# Patient Record
Sex: Female | Born: 1990 | Race: Black or African American | Hispanic: No | Marital: Single | Smoking: Current some day smoker
Health system: Southern US, Community
[De-identification: ages and names within clinical notes are randomized; demographics above are authoritative.]

## PROBLEM LIST (undated history)

## (undated) ENCOUNTER — Inpatient Hospital Stay (HOSPITAL_COMMUNITY): Payer: Self-pay

## (undated) DIAGNOSIS — A749 Chlamydial infection, unspecified: Secondary | ICD-10-CM

## (undated) DIAGNOSIS — G43909 Migraine, unspecified, not intractable, without status migrainosus: Secondary | ICD-10-CM

## (undated) DIAGNOSIS — N390 Urinary tract infection, site not specified: Secondary | ICD-10-CM

## (undated) DIAGNOSIS — A549 Gonococcal infection, unspecified: Secondary | ICD-10-CM

## (undated) DIAGNOSIS — N760 Acute vaginitis: Secondary | ICD-10-CM

## (undated) DIAGNOSIS — I10 Essential (primary) hypertension: Secondary | ICD-10-CM

## (undated) DIAGNOSIS — B999 Unspecified infectious disease: Secondary | ICD-10-CM

## (undated) DIAGNOSIS — B9689 Other specified bacterial agents as the cause of diseases classified elsewhere: Secondary | ICD-10-CM

## (undated) HISTORY — PX: HERNIA REPAIR: SHX51

## (undated) HISTORY — DX: Essential (primary) hypertension: I10

---
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
## 2008-09-23 ENCOUNTER — Emergency Department (HOSPITAL_COMMUNITY): Admission: EM | Admit: 2008-09-23 | Discharge: 2008-09-23 | Payer: Self-pay | Admitting: Family Medicine

## 2009-03-09 ENCOUNTER — Emergency Department (HOSPITAL_COMMUNITY): Admission: EM | Admit: 2009-03-09 | Discharge: 2009-03-09 | Payer: Self-pay | Admitting: Emergency Medicine

## 2009-04-14 ENCOUNTER — Inpatient Hospital Stay (HOSPITAL_COMMUNITY): Admission: AD | Admit: 2009-04-14 | Discharge: 2009-04-14 | Payer: Self-pay | Admitting: Obstetrics & Gynecology

## 2009-06-11 ENCOUNTER — Emergency Department (HOSPITAL_COMMUNITY): Admission: EM | Admit: 2009-06-11 | Discharge: 2009-06-11 | Payer: Self-pay | Admitting: Emergency Medicine

## 2009-07-04 ENCOUNTER — Emergency Department (HOSPITAL_COMMUNITY): Admission: EM | Admit: 2009-07-04 | Discharge: 2009-07-04 | Payer: Self-pay | Admitting: Emergency Medicine

## 2009-10-07 ENCOUNTER — Inpatient Hospital Stay (HOSPITAL_COMMUNITY): Admission: AD | Admit: 2009-10-07 | Discharge: 2009-10-07 | Payer: Self-pay | Admitting: Obstetrics & Gynecology

## 2010-05-12 ENCOUNTER — Inpatient Hospital Stay (HOSPITAL_COMMUNITY): Admission: AD | Admit: 2010-05-12 | Discharge: 2010-05-12 | Payer: Self-pay | Admitting: Obstetrics and Gynecology

## 2010-05-12 ENCOUNTER — Ambulatory Visit: Payer: Self-pay | Admitting: Obstetrics and Gynecology

## 2010-05-19 ENCOUNTER — Inpatient Hospital Stay (HOSPITAL_COMMUNITY): Admission: AD | Admit: 2010-05-19 | Discharge: 2010-05-19 | Payer: Self-pay | Admitting: Obstetrics and Gynecology

## 2010-05-19 ENCOUNTER — Ambulatory Visit: Payer: Self-pay | Admitting: Obstetrics and Gynecology

## 2010-07-10 ENCOUNTER — Inpatient Hospital Stay (HOSPITAL_COMMUNITY)
Admission: AD | Admit: 2010-07-10 | Discharge: 2010-07-10 | Payer: Self-pay | Source: Home / Self Care | Admitting: Obstetrics and Gynecology

## 2010-11-02 LAB — URINE CULTURE
Colony Count: NO GROWTH
Culture  Setup Time: 201111200243
Culture: NO GROWTH

## 2010-11-02 LAB — URINALYSIS, ROUTINE W REFLEX MICROSCOPIC
Bilirubin Urine: NEGATIVE
Glucose, UA: NEGATIVE mg/dL
Ketones, ur: >80 mg/dL — AB
Leukocytes, UA: NEGATIVE
Nitrite: POSITIVE — AB
Protein, ur: 30 mg/dL — AB
Specific Gravity, Urine: >1.03 — ABNORMAL HIGH (ref 1.005–1.030)
Urobilinogen, UA: 0.2 mg/dL (ref 0.0–1.0)
pH: 6 (ref 5.0–8.0)

## 2010-11-02 LAB — URINE MICROSCOPIC-ADD ON

## 2010-11-04 LAB — WET PREP, GENITAL
Clue Cells Wet Prep HPF POC: NONE SEEN
Trich, Wet Prep: NONE SEEN
Trich, Wet Prep: NONE SEEN
Yeast Wet Prep HPF POC: NONE SEEN
Yeast Wet Prep HPF POC: NONE SEEN

## 2010-11-04 LAB — URINALYSIS, ROUTINE W REFLEX MICROSCOPIC
Bilirubin Urine: NEGATIVE
Bilirubin Urine: NEGATIVE
Glucose, UA: NEGATIVE mg/dL
Glucose, UA: NEGATIVE mg/dL
Hgb urine dipstick: NEGATIVE
Ketones, ur: 15 mg/dL — AB
Ketones, ur: NEGATIVE mg/dL
Leukocytes, UA: NEGATIVE
Nitrite: NEGATIVE
Nitrite: NEGATIVE
Protein, ur: NEGATIVE mg/dL
Protein, ur: NEGATIVE mg/dL
Specific Gravity, Urine: 1.02 (ref 1.005–1.030)
Specific Gravity, Urine: 1.02 (ref 1.005–1.030)
Urobilinogen, UA: 0.2 mg/dL (ref 0.0–1.0)
Urobilinogen, UA: 0.2 mg/dL (ref 0.0–1.0)
pH: 6 (ref 5.0–8.0)
pH: 6.5 (ref 5.0–8.0)

## 2010-11-04 LAB — URINE MICROSCOPIC-ADD ON

## 2010-11-04 LAB — POCT PREGNANCY, URINE: Preg Test, Ur: POSITIVE

## 2010-11-04 LAB — GC/CHLAMYDIA PROBE AMP, GENITAL
Chlamydia, DNA Probe: NEGATIVE
Chlamydia, DNA Probe: NEGATIVE
GC Probe Amp, Genital: NEGATIVE
GC Probe Amp, Genital: NEGATIVE

## 2010-11-04 LAB — HCG, QUANTITATIVE, PREGNANCY: hCG, Beta Chain, Quant, S: 4562 m[IU]/mL — ABNORMAL HIGH (ref <5)

## 2010-11-10 LAB — URINALYSIS, ROUTINE W REFLEX MICROSCOPIC
Bilirubin Urine: NEGATIVE
Glucose, UA: NEGATIVE mg/dL
Ketones, ur: NEGATIVE mg/dL
Leukocytes, UA: NEGATIVE
Nitrite: NEGATIVE
Protein, ur: NEGATIVE mg/dL
Specific Gravity, Urine: 1.015 (ref 1.005–1.030)
Urobilinogen, UA: 0.2 mg/dL (ref 0.0–1.0)
pH: 7 (ref 5.0–8.0)

## 2010-11-10 LAB — POCT PREGNANCY, URINE: Preg Test, Ur: NEGATIVE

## 2010-11-10 LAB — WET PREP, GENITAL
Trich, Wet Prep: NONE SEEN
Yeast Wet Prep HPF POC: NONE SEEN

## 2010-11-10 LAB — URINE MICROSCOPIC-ADD ON

## 2010-11-10 LAB — GC/CHLAMYDIA PROBE AMP, GENITAL
Chlamydia, DNA Probe: NEGATIVE
GC Probe Amp, Genital: NEGATIVE

## 2010-11-24 LAB — URINALYSIS, ROUTINE W REFLEX MICROSCOPIC
Bilirubin Urine: NEGATIVE
Glucose, UA: NEGATIVE mg/dL
Hgb urine dipstick: NEGATIVE
Ketones, ur: NEGATIVE mg/dL
Nitrite: NEGATIVE
Protein, ur: NEGATIVE mg/dL
Specific Gravity, Urine: 1.022 (ref 1.005–1.030)
Urobilinogen, UA: 1 mg/dL (ref 0.0–1.0)
pH: 7.5 (ref 5.0–8.0)

## 2010-11-24 LAB — POCT PREGNANCY, URINE: Preg Test, Ur: NEGATIVE

## 2010-11-27 LAB — URINE MICROSCOPIC-ADD ON

## 2010-11-27 LAB — URINALYSIS, ROUTINE W REFLEX MICROSCOPIC
Bilirubin Urine: NEGATIVE
Glucose, UA: NEGATIVE mg/dL
Ketones, ur: NEGATIVE mg/dL
Nitrite: NEGATIVE
Protein, ur: NEGATIVE mg/dL
Specific Gravity, Urine: 1.02 (ref 1.005–1.030)
Urobilinogen, UA: 0.2 mg/dL (ref 0.0–1.0)
pH: 6 (ref 5.0–8.0)

## 2010-11-27 LAB — POCT PREGNANCY, URINE: Preg Test, Ur: NEGATIVE

## 2010-11-27 LAB — GC/CHLAMYDIA PROBE AMP, GENITAL: GC Probe Amp, Genital: NEGATIVE

## 2010-11-28 LAB — RAPID STREP SCREEN (MED CTR MEBANE ONLY): Streptococcus, Group A Screen (Direct): NEGATIVE

## 2010-11-29 ENCOUNTER — Inpatient Hospital Stay (HOSPITAL_COMMUNITY)
Admission: AD | Admit: 2010-11-29 | Discharge: 2010-11-29 | Disposition: A | Discharge status: Home / Self Care | Payer: Medicaid Other | Source: Ambulatory Visit | Attending: Obstetrics & Gynecology | Admitting: Obstetrics & Gynecology

## 2010-11-29 DIAGNOSIS — N390 Urinary tract infection, site not specified: Secondary | ICD-10-CM | POA: Insufficient documentation

## 2010-11-29 DIAGNOSIS — O2390 Unspecified genitourinary tract infection in pregnancy, unspecified trimester: Secondary | ICD-10-CM

## 2010-11-29 DIAGNOSIS — R319 Hematuria, unspecified: Secondary | ICD-10-CM | POA: Insufficient documentation

## 2010-11-29 DIAGNOSIS — N949 Unspecified condition associated with female genital organs and menstrual cycle: Secondary | ICD-10-CM | POA: Insufficient documentation

## 2010-11-29 LAB — URINE MICROSCOPIC-ADD ON

## 2010-11-29 LAB — URINALYSIS, ROUTINE W REFLEX MICROSCOPIC
Glucose, UA: NEGATIVE mg/dL
Nitrite: NEGATIVE
Specific Gravity, Urine: 1.015 (ref 1.005–1.030)
pH: 6 (ref 5.0–8.0)

## 2010-11-30 LAB — URINE CULTURE

## 2010-12-07 LAB — POCT URINALYSIS DIP (DEVICE)
Glucose, UA: NEGATIVE mg/dL
Ketones, ur: NEGATIVE mg/dL
Protein, ur: NEGATIVE mg/dL
Specific Gravity, Urine: 1.02 (ref 1.005–1.030)
Urobilinogen, UA: 1 mg/dL (ref 0.0–1.0)

## 2010-12-07 LAB — POCT PREGNANCY, URINE: Preg Test, Ur: NEGATIVE

## 2010-12-11 ENCOUNTER — Inpatient Hospital Stay (HOSPITAL_COMMUNITY)
Admission: AD | Admit: 2010-12-11 | Discharge: 2010-12-11 | Disposition: A | Discharge status: Home / Self Care | Payer: Medicaid Other | Source: Ambulatory Visit | Attending: Obstetrics and Gynecology | Admitting: Obstetrics and Gynecology

## 2010-12-11 DIAGNOSIS — O4700 False labor before 37 completed weeks of gestation, unspecified trimester: Secondary | ICD-10-CM | POA: Insufficient documentation

## 2010-12-27 ENCOUNTER — Inpatient Hospital Stay (HOSPITAL_COMMUNITY)
Admission: AD | Admit: 2010-12-27 | Discharge: 2010-12-30 | DRG: 775 | Disposition: A | Discharge status: Home / Self Care | Payer: Medicaid Other | Source: Ambulatory Visit | Attending: Obstetrics and Gynecology | Admitting: Obstetrics and Gynecology

## 2010-12-28 LAB — CBC
HCT: 36.7 % (ref 36.0–46.0)
MCV: 85.7 fL (ref 78.0–100.0)
Platelets: 144 10*3/uL — ABNORMAL LOW (ref 150–400)
RBC: 4.28 MIL/uL (ref 3.87–5.11)
RDW: 13.9 % (ref 11.5–15.5)
WBC: 13.9 10*3/uL — ABNORMAL HIGH (ref 4.0–10.5)

## 2010-12-28 LAB — ABO/RH: ABO/RH(D): O POS

## 2010-12-29 LAB — CBC
MCV: 85 fL (ref 78.0–100.0)
Platelets: 134 10*3/uL — ABNORMAL LOW (ref 150–400)
RBC: 3.74 MIL/uL — ABNORMAL LOW (ref 3.87–5.11)
RDW: 13.9 % (ref 11.5–15.5)
WBC: 17.2 10*3/uL — ABNORMAL HIGH (ref 4.0–10.5)

## 2012-05-27 ENCOUNTER — Encounter (HOSPITAL_COMMUNITY): Payer: Self-pay | Admitting: Obstetrics and Gynecology

## 2012-05-27 ENCOUNTER — Inpatient Hospital Stay (HOSPITAL_COMMUNITY)
Admission: AD | Admit: 2012-05-27 | Discharge: 2012-05-27 | Disposition: A | Discharge status: Home / Self Care | Payer: Medicaid Other | Source: Ambulatory Visit | Attending: Obstetrics & Gynecology | Admitting: Obstetrics & Gynecology

## 2012-05-27 DIAGNOSIS — A499 Bacterial infection, unspecified: Secondary | ICD-10-CM

## 2012-05-27 DIAGNOSIS — T8339XA Other mechanical complication of intrauterine contraceptive device, initial encounter: Secondary | ICD-10-CM | POA: Insufficient documentation

## 2012-05-27 DIAGNOSIS — N760 Acute vaginitis: Secondary | ICD-10-CM | POA: Insufficient documentation

## 2012-05-27 DIAGNOSIS — T839XXA Unspecified complication of genitourinary prosthetic device, implant and graft, initial encounter: Secondary | ICD-10-CM

## 2012-05-27 DIAGNOSIS — R300 Dysuria: Secondary | ICD-10-CM | POA: Insufficient documentation

## 2012-05-27 DIAGNOSIS — N390 Urinary tract infection, site not specified: Secondary | ICD-10-CM

## 2012-05-27 DIAGNOSIS — B9689 Other specified bacterial agents as the cause of diseases classified elsewhere: Secondary | ICD-10-CM | POA: Insufficient documentation

## 2012-05-27 HISTORY — DX: Urinary tract infection, site not specified: N39.0

## 2012-05-27 LAB — URINALYSIS, ROUTINE W REFLEX MICROSCOPIC
Leukocytes, UA: NEGATIVE
Nitrite: NEGATIVE
Specific Gravity, Urine: >1.03 — ABNORMAL HIGH (ref 1.005–1.030)
Urobilinogen, UA: 0.2 mg/dL (ref 0.0–1.0)
pH: 6 (ref 5.0–8.0)

## 2012-05-27 LAB — URINE MICROSCOPIC-ADD ON

## 2012-05-27 LAB — WET PREP, GENITAL

## 2012-05-27 LAB — POCT PREGNANCY, URINE: Preg Test, Ur: NEGATIVE

## 2012-05-27 MED ORDER — CIPROFLOXACIN HCL 500 MG PO TABS: 500.0000 mg | ORAL_TABLET | Freq: Two times a day (BID) | ORAL | Status: DC

## 2012-05-27 MED ORDER — METRONIDAZOLE 500 MG PO TABS: 500.0000 mg | ORAL_TABLET | Freq: Two times a day (BID) | ORAL | Status: DC

## 2012-05-27 MED ORDER — NORGESTIMATE-ETH ESTRADIOL 0.25-35 MG-MCG PO TABS: 1.0000 | ORAL_TABLET | Freq: Every day | ORAL | Status: DC

## 2012-05-27 NOTE — MAU Note (Signed)
Pt reports she has been having burning and pain with urination for several month. Has had and odor and whit to brown  vaginal  Discharge. Pt thinks she has a UTI and BV.

## 2012-05-27 NOTE — MAU Note (Signed)
"  I have been having burning when I pee, lower back pain, vaginal odor and vaginal discharge since about June."

## 2012-05-27 NOTE — MAU Provider Note (Signed)
Chief Complaint: Dysuria  First Provider Initiated Contact with Patient 05/27/12 1508     SUBJECTIVE HPI: Eileen Mcfarland is a 21 y.o. G0 who presents with: 1. Burning pain, frequency and urgency with urination for several months.  2. Brownish, mucousy vaginal discharge with odor x several months.  3. Mild left flank pain x ~1 week 4. Upon further questioning pt reports her partner complaining at feeling a sharp poking sensation during IC.  Denies fever, chills, N/V. Had Mirena IUD placed in May 2013.   Past Medical History  Diagnosis Date  . Urinary tract infection    OB History    Grav Para Term Preterm Abortions TAB SAB Ect Mult Living   2    1 1    1      # Outc Date GA Lbr Len/2nd Wgt Sex Del Anes PTL Lv   1 TAB 10/08           2 GRA 5/12    F    Yes     Past Surgical History  Procedure Date  . Hernia repair     as a child   History   Social History  . Marital Status: Single    Spouse Name: N/A    Number of Children: N/A  . Years of Education: N/A   Occupational History  . Not on file.   Social History Main Topics  . Smoking status: Not on file  . Smokeless tobacco: Not on file  . Alcohol Use: Yes     socially  . Drug Use: No  . Sexually Active: Yes    Birth Control/ Protection: IUD   Other Topics Concern  . Not on file   Social History Narrative  . No narrative on file   No current facility-administered medications on file prior to encounter.   Current Outpatient Prescriptions on File Prior to Encounter  Medication Sig Dispense Refill  . norgestimate-ethinyl estradiol (ORTHO-CYCLEN,SPRINTEC,PREVIFEM) 0.25-35 MG-MCG tablet Take 1 tablet by mouth daily.  1 Package  2   No Known Allergies  ROS: Pertinent items in HPI  OBJECTIVE Blood pressure 119/65, pulse 86, temperature 98.3 F (36.8 C), temperature source Oral, resp. rate 18, height 5\' 2"  (1.575 m), weight 53.162 kg (117 lb 3.2 oz), last menstrual period 05/23/2012, unknown if currently  breastfeeding. GENERAL: Well-developed, well-nourished female in no acute distress.  HEENT: Normocephalic HEART: normal rate RESP: normal effort ABDOMEN: Soft, non-tender, mild left CVAT EXTREMITIES: Nontender, no edema NEURO: Alert and oriented SPECULUM EXAM: NEFG, small amount of malodorous, non-purulent mucous tinged w/ brown blood noted, IUD protruding 0.5 cm out of external os. 1 cm ectropion noted. cervix otherwise clean, non-friable.  BIMANUAL: cervix 1 cm; uterus normal size, no CMT, adnexal tenderness or masses  LAB RESULTS Results for orders placed during the hospital encounter of 05/27/12 (from the past 24 hour(s))  URINALYSIS, ROUTINE W REFLEX MICROSCOPIC     Status: Abnormal   Collection Time   05/27/12  1:44 PM      Component Value Range   Color, Urine YELLOW  YELLOW   APPearance CLEAR  CLEAR   Specific Gravity, Urine >1.030 (*) 1.005 - 1.030   pH 6.0  5.0 - 8.0   Glucose, UA NEGATIVE  NEGATIVE mg/dL   Hgb urine dipstick TRACE (*) NEGATIVE   Bilirubin Urine NEGATIVE  NEGATIVE   Ketones, ur NEGATIVE  NEGATIVE mg/dL   Protein, ur NEGATIVE  NEGATIVE mg/dL   Urobilinogen, UA 0.2  0.0 - 1.0  mg/dL   Nitrite NEGATIVE  NEGATIVE   Leukocytes, UA NEGATIVE  NEGATIVE  URINE MICROSCOPIC-ADD ON     Status: Abnormal   Collection Time   05/27/12  1:44 PM      Component Value Range   Squamous Epithelial / LPF RARE  RARE   WBC, UA 0-2  <3 WBC/hpf   RBC / HPF 0-2  <3 RBC/hpf   Bacteria, UA FEW (*) RARE   Urine-Other MUCOUS PRESENT    POCT PREGNANCY, URINE     Status: Normal   Collection Time   05/27/12  2:08 PM      Component Value Range   Preg Test, Ur NEGATIVE  NEGATIVE  WET PREP, GENITAL     Status: Abnormal   Collection Time   05/27/12  2:45 PM      Component Value Range   Yeast Wet Prep HPF POC NONE SEEN  NONE SEEN   Trich, Wet Prep NONE SEEN  NONE SEEN   Clue Cells Wet Prep HPF POC MODERATE (*) NONE SEEN   WBC, Wet Prep HPF POC FEW (*) NONE SEEN    IMAGING No  results found.  MAU COURSE Discussed w/ pt that IUD is being expelled. Gave her option of removal at private OB or removal now. Recommended no IC until removal and to be prepared for alternative contraception and increased cramping w/ completion of expulsion. Pt elects removal in MAU.  Procedure Note: Speculum placed. IUD strings and end of IUD visualized. Strings grasped w/ ring forceps. IUD removed easily w/ gentle traction. No bleeding. Pt tolerated procedure well.    ASSESSMENT 1. BV (bacterial vaginosis)   2. IUD complication-partial expulsion  3. UTI (lower urinary tract infection) with possible mild left pyelo    PLAN Discharge home Start OCPs today. List of contraceptive options given. Take Cipro BID for 1 week. If Sx improving, but incompletely resolved refill and Rx and take x 1 more week. Return to MAU or ED for fever, vomiting or worsening flank pain for possible IV ABX.     Follow-up Information    Follow up with Levi Aland, MD. (As needed for contraception)    Contact information:   [address] [place] [address] [phone]           Medication List     As of 05/27/2012  3:39 PM    TAKE these medications         ciprofloxacin 500 MG tablet   Commonly known as: CIPRO   Take 1 tablet (500 mg total) by mouth 2 (two) times daily.      metroNIDAZOLE 500 MG tablet   Commonly known as: FLAGYL   Take 1 tablet (500 mg total) by mouth 2 (two) times daily.      norgestimate-ethinyl estradiol 0.25-35 MG-MCG tablet   Commonly known as: ORTHO-CYCLEN,SPRINTEC,PREVIFEM   Take 1 tablet by mouth daily.        [place], [place] 05/27/2012  3:39 PM

## 2012-05-28 LAB — GC/CHLAMYDIA PROBE AMP, GENITAL
Chlamydia, DNA Probe: NEGATIVE
GC Probe Amp, Genital: NEGATIVE

## 2012-08-15 ENCOUNTER — Emergency Department (HOSPITAL_COMMUNITY)
Admission: EM | Admit: 2012-08-15 | Discharge: 2012-08-16 | Disposition: A | Discharge status: Home / Self Care | Payer: Medicaid Other | Attending: Emergency Medicine | Admitting: Emergency Medicine

## 2012-08-15 ENCOUNTER — Encounter (HOSPITAL_COMMUNITY): Payer: Self-pay | Admitting: Emergency Medicine

## 2012-08-15 DIAGNOSIS — R928 Other abnormal and inconclusive findings on diagnostic imaging of breast: Secondary | ICD-10-CM | POA: Insufficient documentation

## 2012-08-15 DIAGNOSIS — F17200 Nicotine dependence, unspecified, uncomplicated: Secondary | ICD-10-CM | POA: Insufficient documentation

## 2012-08-15 DIAGNOSIS — R51 Headache: Secondary | ICD-10-CM | POA: Insufficient documentation

## 2012-08-15 DIAGNOSIS — N644 Mastodynia: Secondary | ICD-10-CM | POA: Insufficient documentation

## 2012-08-15 DIAGNOSIS — N390 Urinary tract infection, site not specified: Secondary | ICD-10-CM | POA: Insufficient documentation

## 2012-08-15 DIAGNOSIS — Z3202 Encounter for pregnancy test, result negative: Secondary | ICD-10-CM | POA: Insufficient documentation

## 2012-08-15 DIAGNOSIS — N63 Unspecified lump in unspecified breast: Secondary | ICD-10-CM

## 2012-08-15 LAB — CBC
MCV: 87.2 fL (ref 78.0–100.0)
Platelets: 265 10*3/uL (ref 150–400)
RBC: 4.52 MIL/uL (ref 3.87–5.11)
RDW: 12.9 % (ref 11.5–15.5)
WBC: 7.7 10*3/uL (ref 4.0–10.5)

## 2012-08-15 LAB — URINE MICROSCOPIC-ADD ON

## 2012-08-15 LAB — URINALYSIS, ROUTINE W REFLEX MICROSCOPIC
Glucose, UA: NEGATIVE mg/dL
Nitrite: NEGATIVE
Protein, ur: NEGATIVE mg/dL
Urobilinogen, UA: 1 mg/dL (ref 0.0–1.0)

## 2012-08-15 LAB — BASIC METABOLIC PANEL
Chloride: 101 mEq/L (ref 96–112)
Creatinine, Ser: 0.69 mg/dL (ref 0.50–1.10)
GFR calc Af Amer: >90 mL/min (ref >90)
Sodium: 135 mEq/L (ref 135–145)

## 2012-08-15 LAB — PREGNANCY, URINE: Preg Test, Ur: NEGATIVE

## 2012-08-15 MED ORDER — CEPHALEXIN 500 MG PO CAPS: 500.0000 mg | ORAL_CAPSULE | Freq: Four times a day (QID) | ORAL | Status: DC

## 2012-08-15 MED ORDER — HYDROCODONE-ACETAMINOPHEN 5-500 MG PO TABS: 1.0000 | ORAL_TABLET | Freq: Four times a day (QID) | ORAL | Status: DC | PRN

## 2012-08-15 MED ORDER — CEPHALEXIN 500 MG PO CAPS
500.0000 mg | ORAL_CAPSULE | Freq: Once | ORAL | Status: AC
  Administered 2012-08-15: 500 mg via ORAL
  Filled 2012-08-15: qty 1

## 2012-08-15 NOTE — ED Notes (Signed)
Pt states she has had dizziness and nausea x 2 weeks. Pt also states that she found a lump in her rt breast on the 23rd and it is hurting.

## 2012-08-15 NOTE — ED Provider Notes (Signed)
History     CSN: [id number]  Arrival date & time 08/15/12  2116   First MD Initiated Contact with Patient 08/15/12 2141      Chief Complaint  Patient presents with  . Dizziness  . Headache    (Consider location/radiation/quality/duration/timing/severity/associated sxs/prior treatment) HPI Eileen Mcfarland is a 21 y.o. female who presents with complaint of pain in her left breast, nodule in left breast that she noted 2 days ago. States it is tender. States no hx of the same. Took hydrocodone for the pain with some improvement. Also reports headache, dizziness on and off for last two weeks. Denies fever, chills, malaise. No injury to the breast. States due for her menustrual cycle any day, not pregnant. Denies nipple discharge. No hx of abscesses.   Past Medical History  Diagnosis Date  . Urinary tract infection     Past Surgical History  Procedure Date  . Hernia repair     as a child    No family history on file.  History  Substance Use Topics  . Smoking status: Current Some Day Smoker -- 3.0 packs/day    Types: Cigars  . Smokeless tobacco: Not on file  . Alcohol Use: Yes     Comment: socially    OB History    Grav Para Term Preterm Abortions TAB SAB Ect Mult Living   2    1 1    1       Review of Systems  Constitutional: Negative for fever and chills.  Respiratory: Negative.   Cardiovascular: Negative.   Gastrointestinal: Negative.   Genitourinary: Negative.   Musculoskeletal: Negative.   Skin: Negative.   Neurological: Positive for dizziness, light-headedness and headaches. Negative for weakness.  Hematological: Negative.     Allergies  Review of patient's allergies indicates no known allergies.  Home Medications  No current outpatient prescriptions on file.  BP 150/87  Pulse 107  Temp 98.8 F (37.1 C) (Oral)  Resp 20  SpO2 100%  LMP 07/19/2012  Breastfeeding? No  Physical Exam  Nursing note and vitals reviewed. Constitutional: She is oriented  to person, place, and time. She appears well-developed and well-nourished. No distress.  HENT:  Head: Normocephalic.  Eyes: Conjunctivae normal are normal.  Neck: Normal range of motion. Neck supple.  Cardiovascular: Normal rate, regular rhythm and normal heart sounds.   Pulmonary/Chest: Effort normal and breath sounds normal. No respiratory distress. She has no wheezes. She has no rales.       About 3cmx3cm nodule in left mid breast just below the nipple. Nodule is deep, tender. No skin changes over the breast. No nipple discharge.   Abdominal: Soft. Bowel sounds are normal. She exhibits no distension. There is no tenderness. There is no rebound.  Musculoskeletal: She exhibits no edema.  Neurological: She is alert and oriented to person, place, and time.  Skin: Skin is warm and dry.  Psychiatric: She has a normal mood and affect. Her behavior is normal.    ED Course  Procedures (including critical care time)  Results for orders placed during the hospital encounter of 08/15/12  CBC      Component Value Range   WBC 7.7  4.0 - 10.5 K/uL   RBC 4.52  3.87 - 5.11 MIL/uL   Hemoglobin 13.2  12.0 - 15.0 g/dL   HCT 40.9  81.1 - 91.4 %   MCV 87.2  78.0 - 100.0 fL   MCH 29.2  26.0 - 34.0 pg   MCHC 33.5  30.0 - 36.0 g/dL   RDW 16.1  09.6 - 04.5 %   Platelets 265  150 - 400 K/uL  BASIC METABOLIC PANEL      Component Value Range   Sodium 135  135 - 145 mEq/L   Potassium 3.8  3.5 - 5.1 mEq/L   Chloride 101  96 - 112 mEq/L   CO2 23  19 - 32 mEq/L   Glucose, Bld 89  70 - 99 mg/dL   BUN 10  6 - 23 mg/dL   Creatinine, Ser 4.09  0.50 - 1.10 mg/dL   Calcium 9.3  8.4 - 81.1 mg/dL   GFR calc non Af Amer >90  >90 mL/min   GFR calc Af Amer >90  >90 mL/min  PREGNANCY, URINE      Component Value Range   Preg Test, Ur NEGATIVE  NEGATIVE  URINALYSIS, ROUTINE W REFLEX MICROSCOPIC      Component Value Range   Color, Urine YELLOW  YELLOW   APPearance CLOUDY (*) CLEAR   Specific Gravity, Urine 1.024   1.005 - 1.030   pH 6.5  5.0 - 8.0   Glucose, UA NEGATIVE  NEGATIVE mg/dL   Hgb urine dipstick TRACE (*) NEGATIVE   Bilirubin Urine NEGATIVE  NEGATIVE   Ketones, ur NEGATIVE  NEGATIVE mg/dL   Protein, ur NEGATIVE  NEGATIVE mg/dL   Urobilinogen, UA 1.0  0.0 - 1.0 mg/dL   Nitrite NEGATIVE  NEGATIVE   Leukocytes, UA LARGE (*) NEGATIVE  URINE MICROSCOPIC-ADD ON      Component Value Range   Squamous Epithelial / LPF MANY (*) RARE   WBC, UA 11-20  <3 WBC/hpf   RBC / HPF 0-2  <3 RBC/hpf   Bacteria, UA MANY (*) RARE   No results found.    1. Breast pain   2. Breast nodule   3. UTI (lower urinary tract infection)       MDM  Pt with tender nodule/cyst to the left breast. Worrisome for small early abscess, vs a cyst. There is no redness, no skin changes. Pt afebrile. Non toxic appearing.  Pt also has a UTI. Will cover with keflex. First dose given here. Will refer to breast center in AM with [place] of left breast ordered. Pt instructed to go there for further testing. Pt voiced understanding.         Lottie Mussel, PA 08/16/12 [phone]

## 2012-08-15 NOTE — ED Notes (Signed)
Pt presents to the ED with a complaint of dizziness and a headache for over 2 weeks.  Pt also states" I found a lump in my left breast and it has started to hurt."

## 2012-08-16 ENCOUNTER — Ambulatory Visit
Admission: RE | Admit: 2012-08-16 | Discharge: 2012-08-16 | Disposition: A | Discharge status: Home / Self Care | Payer: Medicaid Other | Source: Ambulatory Visit | Attending: Emergency Medicine | Admitting: Emergency Medicine

## 2012-08-17 LAB — URINE CULTURE
Colony Count: NO GROWTH
Culture: NO GROWTH

## 2012-08-18 NOTE — ED Provider Notes (Signed)
Medical screening examination/treatment/procedure(s) were performed by non-physician practitioner and as supervising physician I was immediately available for consultation/collaboration.  Joclynn Lumb, MD 08/18/12 1815 

## 2012-11-04 ENCOUNTER — Inpatient Hospital Stay (HOSPITAL_COMMUNITY)
Admission: AD | Admit: 2012-11-04 | Discharge: 2012-11-04 | Disposition: A | Discharge status: Home / Self Care | Payer: Medicaid Other | Source: Ambulatory Visit | Attending: Obstetrics & Gynecology | Admitting: Obstetrics & Gynecology

## 2012-11-04 ENCOUNTER — Encounter (HOSPITAL_COMMUNITY): Payer: Self-pay | Admitting: Family

## 2012-11-04 DIAGNOSIS — Z331 Pregnant state, incidental: Secondary | ICD-10-CM

## 2012-11-04 DIAGNOSIS — R300 Dysuria: Secondary | ICD-10-CM | POA: Insufficient documentation

## 2012-11-04 DIAGNOSIS — N949 Unspecified condition associated with female genital organs and menstrual cycle: Secondary | ICD-10-CM | POA: Insufficient documentation

## 2012-11-04 DIAGNOSIS — Z113 Encounter for screening for infections with a predominantly sexual mode of transmission: Secondary | ICD-10-CM

## 2012-11-04 DIAGNOSIS — Z7251 High risk heterosexual behavior: Secondary | ICD-10-CM

## 2012-11-04 DIAGNOSIS — R109 Unspecified abdominal pain: Secondary | ICD-10-CM | POA: Insufficient documentation

## 2012-11-04 DIAGNOSIS — Z202 Contact with and (suspected) exposure to infections with a predominantly sexual mode of transmission: Secondary | ICD-10-CM | POA: Insufficient documentation

## 2012-11-04 DIAGNOSIS — Z3201 Encounter for pregnancy test, result positive: Secondary | ICD-10-CM | POA: Insufficient documentation

## 2012-11-04 HISTORY — DX: Unspecified infectious disease: B99.9

## 2012-11-04 HISTORY — DX: Chlamydial infection, unspecified: A74.9

## 2012-11-04 LAB — CBC
MCH: 29.3 pg (ref 26.0–34.0)
MCHC: 33.6 g/dL (ref 30.0–36.0)
RDW: 13.4 % (ref 11.5–15.5)

## 2012-11-04 LAB — URINALYSIS, ROUTINE W REFLEX MICROSCOPIC
Nitrite: NEGATIVE
Protein, ur: NEGATIVE mg/dL
Urobilinogen, UA: 0.2 mg/dL (ref 0.0–1.0)

## 2012-11-04 LAB — POCT PREGNANCY, URINE: Preg Test, Ur: POSITIVE — AB

## 2012-11-04 LAB — WET PREP, GENITAL

## 2012-11-04 LAB — HCG, QUANTITATIVE, PREGNANCY: hCG, Beta Chain, Quant, S: 48 m[IU]/mL — ABNORMAL HIGH (ref <5)

## 2012-11-04 NOTE — MAU Provider Note (Signed)
History     CSN: [id number]  Arrival date and time: 11/04/12 1835   None     Chief Complaint  Patient presents with  . Exposure to STD   HPI 22 y.o. [id number] with possible STD exposure, partner with dysuria and penile discharge. Pt c/o low abd pain, small discharge with odor, + dysuria, no irritation, no vaginal bleeding.    Past Medical History  Diagnosis Date  . Urinary tract infection   . Chlamydia   . Infection     Past Surgical History  Procedure Laterality Date  . Hernia repair      as a child    History reviewed. No pertinent family history.  History  Substance Use Topics  . Smoking status: Current Some Day Smoker -- 3.00 packs/day    Types: Cigars  . Smokeless tobacco: Not on file  . Alcohol Use: Yes     Comment: socially    Allergies: No Known Allergies  No prescriptions prior to admission    Review of Systems  Constitutional: Negative.   Respiratory: Negative.   Cardiovascular: Negative.   Gastrointestinal: Positive for abdominal pain. Negative for nausea, vomiting, diarrhea and constipation.  Genitourinary: Negative for dysuria, urgency, frequency, hematuria and flank pain.       Negative for vaginal bleeding, + vaginal discharge   Musculoskeletal: Negative.   Neurological: Negative.   Psychiatric/Behavioral: Negative.    Physical Exam   Blood pressure 126/66, pulse 96, temperature 98.6 F (37 C), temperature source Oral, resp. rate 18, height 5\' 3"  (1.6 m), weight 50.349 kg (111 lb), last menstrual period 10/11/2012.  Physical Exam  Nursing note and vitals reviewed. Constitutional: She is oriented to person, place, and time. She appears well-developed and well-nourished. No distress.  HENT:  Head: Normocephalic and atraumatic.  Cardiovascular: Normal rate and regular rhythm.   Respiratory: Effort normal. No respiratory distress.  GI: Soft. Bowel sounds are normal. She exhibits no distension and no mass. There is no tenderness. There is  no rebound and no guarding.  Genitourinary: There is no rash or lesion on the right labia. There is no rash or lesion on the left labia. Uterus is not deviated, not enlarged, not fixed and not tender. Cervix exhibits no motion tenderness, no discharge and no friability. Right adnexum displays tenderness. Right adnexum displays no mass and no fullness. Left adnexum displays no mass, no tenderness and no fullness. No erythema, tenderness or bleeding around the vagina. Vaginal discharge (white, bubbly, malodorous) found.  Neurological: She is alert and oriented to person, place, and time.  Skin: Skin is warm and dry.  Psychiatric: She has a normal mood and affect.    MAU Course  Procedures Results for orders placed during the hospital encounter of 11/04/12 (from the past 72 hour(s))  URINALYSIS, ROUTINE W REFLEX MICROSCOPIC     Status: Abnormal   Collection Time    11/04/12  6:50 PM      Result Value Range   Color, Urine YELLOW  YELLOW   APPearance CLEAR  CLEAR   Specific Gravity, Urine 1.020  1.005 - 1.030   pH 7.0  5.0 - 8.0   Glucose, UA NEGATIVE  NEGATIVE mg/dL   Hgb urine dipstick SMALL (*) NEGATIVE   Bilirubin Urine NEGATIVE  NEGATIVE   Ketones, ur NEGATIVE  NEGATIVE mg/dL   Protein, ur NEGATIVE  NEGATIVE mg/dL   Urobilinogen, UA 0.2  0.0 - 1.0 mg/dL   Nitrite NEGATIVE  NEGATIVE   Leukocytes, UA MODERATE (*)  NEGATIVE  URINE MICROSCOPIC-ADD ON     Status: Abnormal   Collection Time    11/04/12  6:50 PM      Result Value Range   Squamous Epithelial / LPF FEW (*) RARE   WBC, UA 21-50  <3 WBC/hpf   RBC / HPF 0-2  <3 RBC/hpf   Bacteria, UA MANY (*) RARE  POCT PREGNANCY, URINE     Status: Abnormal   Collection Time    11/04/12  6:57 PM      Result Value Range   Preg Test, Ur POSITIVE (*) NEGATIVE   Comment:            THE SENSITIVITY OF THIS     METHODOLOGY IS >24 mIU/mL  CBC     Status: None   Collection Time    11/04/12  7:33 PM      Result Value Range   WBC 9.8  4.0 -  10.5 K/uL   RBC 4.37  3.87 - 5.11 MIL/uL   Hemoglobin 12.8  12.0 - 15.0 g/dL   HCT 16.1  09.6 - 04.5 %   MCV 87.2  78.0 - 100.0 fL   MCH 29.3  26.0 - 34.0 pg   MCHC 33.6  30.0 - 36.0 g/dL   RDW 40.9  81.1 - 91.4 %   Platelets 238  150 - 400 K/uL  HCG, QUANTITATIVE, PREGNANCY     Status: Abnormal   Collection Time    11/04/12  7:33 PM      Result Value Range   hCG, Beta Chain, Quant, S 48 (*) <5 mIU/mL   Comment:              GEST. AGE      CONC.  (mIU/mL)       <=1 WEEK        5 - 50         2 WEEKS       50 - 500         3 WEEKS       100 - 10,000         4 WEEKS     1,000 - 30,000         5 WEEKS     3,500 - 115,000       6-8 WEEKS     12,000 - 270,000        12 WEEKS     15,000 - 220,000                FEMALE AND NON-PREGNANT FEMALE:         LESS THAN 5 mIU/mL  WET PREP, GENITAL     Status: Abnormal   Collection Time    11/04/12  7:50 PM      Result Value Range   Yeast Wet Prep HPF POC NONE SEEN  NONE SEEN   Trich, Wet Prep NONE SEEN  NONE SEEN   Clue Cells Wet Prep HPF POC FEW (*) NONE SEEN   WBC, Wet Prep HPF POC FEW (*) NONE SEEN   Comment: MANY BACTERIA SEEN     Assessment and Plan  Quant HCG, CBC, Wet prep, GC/CT  Care assumed by Thressa Sheller, CNM  A/P: Pregnancy as incidental finding Quant is 48 at this time. Will repeat quant in 48 hours and proceed with ultrasound PRN.  1st trimester and ectopic precautions given   Tawnya Crook 11/04/2012, 8:43 PM

## 2012-11-04 NOTE — MAU Note (Addendum)
Patient presents to MAU with c/o her current sexual partner told her today that he has penile discharge and dysuria. She is here to be treated for STI exposure.  She is not using any contraception at this time; plans to call [facility] tomorrow for an April appointment for birth control. Reports that she has burning with urination also and some infrequent abdominal cramping.

## 2012-11-04 NOTE — MAU Note (Signed)
Pt presents with complaints of being exposed to a STD. She states that her partner told her he was having a discharge.

## 2012-11-06 ENCOUNTER — Encounter (HOSPITAL_COMMUNITY): Payer: Self-pay | Admitting: Obstetrics and Gynecology

## 2012-11-06 ENCOUNTER — Inpatient Hospital Stay (HOSPITAL_COMMUNITY)
Admission: AD | Admit: 2012-11-06 | Discharge: 2012-11-06 | Disposition: A | Discharge status: Home / Self Care | Payer: Medicaid Other | Source: Ambulatory Visit | Attending: Obstetrics & Gynecology | Admitting: Obstetrics & Gynecology

## 2012-11-06 DIAGNOSIS — A5619 Other chlamydial genitourinary infection: Secondary | ICD-10-CM | POA: Insufficient documentation

## 2012-11-06 DIAGNOSIS — A749 Chlamydial infection, unspecified: Secondary | ICD-10-CM

## 2012-11-06 DIAGNOSIS — O98219 Gonorrhea complicating pregnancy, unspecified trimester: Secondary | ICD-10-CM | POA: Insufficient documentation

## 2012-11-06 DIAGNOSIS — N739 Female pelvic inflammatory disease, unspecified: Secondary | ICD-10-CM | POA: Insufficient documentation

## 2012-11-06 DIAGNOSIS — A5400 Gonococcal infection of lower genitourinary tract, unspecified: Secondary | ICD-10-CM | POA: Insufficient documentation

## 2012-11-06 DIAGNOSIS — O98211 Gonorrhea complicating pregnancy, first trimester: Secondary | ICD-10-CM

## 2012-11-06 DIAGNOSIS — O98319 Other infections with a predominantly sexual mode of transmission complicating pregnancy, unspecified trimester: Secondary | ICD-10-CM | POA: Insufficient documentation

## 2012-11-06 DIAGNOSIS — O99891 Other specified diseases and conditions complicating pregnancy: Secondary | ICD-10-CM | POA: Insufficient documentation

## 2012-11-06 LAB — GC/CHLAMYDIA PROBE AMP: CT Probe RNA: POSITIVE — AB

## 2012-11-06 LAB — URINE CULTURE: Colony Count: >=100000

## 2012-11-06 LAB — HCG, QUANTITATIVE, PREGNANCY: hCG, Beta Chain, Quant, S: 146 m[IU]/mL — ABNORMAL HIGH (ref <5)

## 2012-11-06 MED ORDER — CEFTRIAXONE SODIUM 250 MG IJ SOLR
250.0000 mg | INTRAMUSCULAR | Status: DC
  Administered 2012-11-06: 250 mg via INTRAMUSCULAR
  Filled 2012-11-06: qty 250

## 2012-11-06 MED ORDER — AZITHROMYCIN 250 MG PO TABS
1000.0000 mg | ORAL_TABLET | Freq: Once | ORAL | Status: AC
  Administered 2012-11-06: 1000 mg via ORAL
  Filled 2012-11-06: qty 4

## 2012-11-06 NOTE — MAU Provider Note (Signed)
Eileen Mcfarland is a 22 y.o. female who returns to MAU for follow up Bhcg. When she was evaluated here 2 days ago the Bhcg was 48. At that visit she was also screened for STI's because her partner told her he was infected. She was not treated at the time of that visit.  Cultures for both GC and Chlamydia are positive.   BP 126/86  Pulse 86  Temp(Src) 100 F (37.8 C) (Oral)  Resp 18  LMP 10/11/2012  Results for orders placed during the hospital encounter of 11/06/12 (from the past 24 hour(s))  HCG, QUANTITATIVE, PREGNANCY     Status: Abnormal   Collection Time    11/06/12  4:17 PM      Result Value Range   hCG, Beta Chain, Quant, S 146 (*) <5 mIU/mL    Assessment: 22 y.o. female with normal rise in Bhcg   Gonorrhea   Chlamydia  Plan:  Rocephin 250 mg IM   Zithromax 1 gram PO   Follow up in 10 days for ultrasound, return sooner for problems   Ectopic precautions   Pelvic rest I have reviewed this patient's vital signs, nurses notes, appropriate labs and discussed plan of care with the patient.   Patient states that she may go for an abortion and if she does she will cancel the appointment for the ultrasound.

## 2012-11-06 NOTE — Progress Notes (Signed)
Pt states she is not going to keep this pregnancy. I advised her to cancel the [place] appointment if she has the procedure prior to when she is due to come back. Mayer Camel NP agreed with plan.

## 2012-11-06 NOTE — MAU Note (Signed)
Name and DOB verified.  States partner did get treated.

## 2012-11-07 ENCOUNTER — Telehealth: Payer: Self-pay | Admitting: Nurse Practitioner

## 2012-11-07 NOTE — MAU Provider Note (Signed)
Attestation of Attending Supervision of Advanced Practitioner (CNM/NP): Evaluation and management procedures were performed by the Advanced Practitioner under my supervision and collaboration.  I have reviewed the Advanced Practitioner's note and chart, and I agree with the management and plan.  HARRAWAY-SMITH, Maleeya Peterkin 9:44 AM     

## 2012-11-07 NOTE — Telephone Encounter (Signed)
Urine Culture reviewed.  Was given Rocephin 250 mg IM at last visit but may not cover UTI.  Called client and discussed with her.  Keflex 500 mg PO Q6h x 7 days (#28) no refills called in to [facility] at [place] [address].

## 2012-11-07 NOTE — MAU Provider Note (Signed)
Attestation of Attending Supervision of Advanced Practitioner (PA/CNM/NP): Evaluation and management procedures were performed by the Advanced Practitioner under my supervision and collaboration.  I have reviewed the Advanced Practitioner's note and chart, and I agree with the management and plan.  Roxanna Mcever, MD, FACOG Attending Obstetrician & Gynecologist Faculty Practice, [facility]  

## 2012-11-16 ENCOUNTER — Other Ambulatory Visit (HOSPITAL_COMMUNITY): Payer: Self-pay | Admitting: Nurse Practitioner

## 2012-11-16 ENCOUNTER — Ambulatory Visit (HOSPITAL_COMMUNITY)
Admission: RE | Admit: 2012-11-16 | Discharge: 2012-11-16 | Disposition: A | Discharge status: Home / Self Care | Payer: Medicaid Other | Source: Ambulatory Visit | Attending: Nurse Practitioner | Admitting: Nurse Practitioner

## 2012-11-16 DIAGNOSIS — Z3689 Encounter for other specified antenatal screening: Secondary | ICD-10-CM | POA: Insufficient documentation

## 2012-11-16 DIAGNOSIS — O3680X1 Pregnancy with inconclusive fetal viability, fetus 1: Secondary | ICD-10-CM

## 2012-11-16 DIAGNOSIS — O3680X0 Pregnancy with inconclusive fetal viability, not applicable or unspecified: Secondary | ICD-10-CM | POA: Insufficient documentation

## 2012-12-29 ENCOUNTER — Inpatient Hospital Stay (HOSPITAL_COMMUNITY)
Admission: AD | Admit: 2012-12-29 | Discharge: 2012-12-29 | Disposition: A | Discharge status: Home / Self Care | Payer: Medicaid Other | Source: Ambulatory Visit | Attending: Obstetrics & Gynecology | Admitting: Obstetrics & Gynecology

## 2012-12-29 ENCOUNTER — Inpatient Hospital Stay (HOSPITAL_COMMUNITY): Payer: Medicaid Other

## 2012-12-29 ENCOUNTER — Encounter (HOSPITAL_COMMUNITY): Payer: Self-pay | Admitting: *Deleted

## 2012-12-29 DIAGNOSIS — O034 Incomplete spontaneous abortion without complication: Secondary | ICD-10-CM

## 2012-12-29 DIAGNOSIS — O070 Genital tract and pelvic infection following failed attempted termination of pregnancy: Secondary | ICD-10-CM | POA: Insufficient documentation

## 2012-12-29 DIAGNOSIS — O039 Complete or unspecified spontaneous abortion without complication: Secondary | ICD-10-CM

## 2012-12-29 DIAGNOSIS — N390 Urinary tract infection, site not specified: Secondary | ICD-10-CM

## 2012-12-29 DIAGNOSIS — R509 Fever, unspecified: Secondary | ICD-10-CM | POA: Insufficient documentation

## 2012-12-29 DIAGNOSIS — J029 Acute pharyngitis, unspecified: Secondary | ICD-10-CM

## 2012-12-29 LAB — URINALYSIS, ROUTINE W REFLEX MICROSCOPIC
Bilirubin Urine: NEGATIVE
Glucose, UA: NEGATIVE mg/dL
Ketones, ur: NEGATIVE mg/dL
Leukocytes, UA: NEGATIVE
Nitrite: NEGATIVE
Protein, ur: NEGATIVE mg/dL
Specific Gravity, Urine: 1.02 (ref 1.005–1.030)
Urobilinogen, UA: 1 mg/dL (ref 0.0–1.0)
pH: 6.5 (ref 5.0–8.0)

## 2012-12-29 LAB — RAPID STREP SCREEN (MED CTR MEBANE ONLY): Streptococcus, Group A Screen (Direct): NEGATIVE

## 2012-12-29 LAB — URINE MICROSCOPIC-ADD ON

## 2012-12-29 LAB — CBC
Hemoglobin: 11 g/dL — ABNORMAL LOW (ref 12.0–15.0)
RBC: 3.84 MIL/uL — ABNORMAL LOW (ref 3.87–5.11)
WBC: 11.1 10*3/uL — ABNORMAL HIGH (ref 4.0–10.5)

## 2012-12-29 LAB — HCG, QUANTITATIVE, PREGNANCY: hCG, Beta Chain, Quant, S: 107 m[IU]/mL — ABNORMAL HIGH (ref <5)

## 2012-12-29 MED ORDER — PROMETHAZINE HCL 25 MG PO TABS: 25.0000 mg | ORAL_TABLET | Freq: Four times a day (QID) | ORAL | Status: DC | PRN

## 2012-12-29 MED ORDER — CEPHALEXIN 500 MG PO CAPS: 500.0000 mg | ORAL_CAPSULE | Freq: Four times a day (QID) | ORAL | Status: AC

## 2012-12-29 MED ORDER — MISOPROSTOL 200 MCG PO TABS
800.0000 ug | ORAL_TABLET | Freq: Once | ORAL | Status: AC
  Administered 2012-12-29: 800 ug via VAGINAL
  Filled 2012-12-29: qty 4

## 2012-12-29 MED ORDER — IBUPROFEN 600 MG PO TABS: 600.0000 mg | ORAL_TABLET | Freq: Four times a day (QID) | ORAL | Status: DC | PRN

## 2012-12-29 NOTE — Progress Notes (Signed)
Written and verbal d/c instructions given and understanding voiced. 

## 2012-12-29 NOTE — MAU Note (Addendum)
I had TAB 12/01/12 and bleeding stopped. Tonight started having heavy bleeding, I've had back and abdominal pains, sore legs, sore throat, fever last couple days. Highest temp 100.4. TAB done at [facility] in [place]

## 2012-12-29 NOTE — MAU Provider Note (Signed)
Chief Complaint: Sore Throat and Fever  First Provider Initiated Contact with Patient 12/29/12 0432     SUBJECTIVE HPI: Eileen Mcfarland is a 22 y.o. [id number]  who presents with:     TAB 12/01/12 and bleeding stopped. Tonight started having heavy bleeding, I've had back and abdominal pains, sore legs, sore throat, fever last couple days. Highest temp 100.4. TAB done at [facility] in [place]   Past Medical History  Diagnosis Date  . Urinary tract infection   . Chlamydia   . Infection    OB History   Grav Para Term Preterm Abortions TAB SAB Ect Mult Living   3    1 1    1      # Outc Date GA Lbr Len/2nd Wgt Sex Del Anes PTL Lv   1 TAB 10/08           2 GRA 5/12    F    Yes   3 CUR              Past Surgical History  Procedure Laterality Date  . Hernia repair      as a child  . Dilation and curettage of uterus     History   Social History  . Marital Status: Single    Spouse Name: N/A    Number of Children: N/A  . Years of Education: N/A   Occupational History  . Not on file.   Social History Main Topics  . Smoking status: Current Some Day Smoker -- 3.00 packs/day    Types: Cigars  . Smokeless tobacco: Not on file  . Alcohol Use: Yes     Comment: socially  . Drug Use: No  . Sexually Active: Yes    Birth Control/ Protection: None   Other Topics Concern  . Not on file   Social History Narrative  . No narrative on file   No current facility-administered medications on file prior to encounter.   No current outpatient prescriptions on file prior to encounter.   No Known Allergies  ROS: Pertinent items in HPI  OBJECTIVE Blood pressure 122/67, pulse 96, temperature 97.8 F (36.6 C), temperature source Oral, resp. rate 18, height 5\' 3"  (1.6 m), weight 50.44 kg (111 lb 3.2 oz), last menstrual period 10/11/2012, SpO2 100.00%. GENERAL: Well-developed, well-nourished female in no acute distress.  HEENT: Normocephalic HEART: normal rate RESP: normal  effort ABDOMEN: Soft, non-tender EXTREMITIES: Nontender, no edema NEURO: Alert and oriented SPECULUM EXAM: NEFG, physiologic discharge, small-moderate dark red blood and clots. Cervix clean BIMANUAL: cervix closed; uterus slightly enlarge size, no adnexal tenderness or masses  LAB RESULTS Results for orders placed during the hospital encounter of 12/29/12 (from the past 24 hour(s))  URINALYSIS, ROUTINE W REFLEX MICROSCOPIC     Status: Abnormal   Collection Time    12/29/12  1:05 AM      Result Value Range   Color, Urine RED (*) YELLOW   APPearance HAZY (*) CLEAR   Specific Gravity, Urine 1.010  1.005 - 1.030   pH 6.0  5.0 - 8.0   Glucose, UA NEGATIVE  NEGATIVE mg/dL   Hgb urine dipstick LARGE (*) NEGATIVE   Bilirubin Urine NEGATIVE  NEGATIVE   Ketones, ur NEGATIVE  NEGATIVE mg/dL   Protein, ur NEGATIVE  NEGATIVE mg/dL   Urobilinogen, UA 0.2  0.0 - 1.0 mg/dL   Nitrite NEGATIVE  NEGATIVE   Leukocytes, UA NEGATIVE  NEGATIVE  URINE MICROSCOPIC-ADD ON  Status: None   Collection Time    12/29/12  1:05 AM      Result Value Range   Squamous Epithelial / LPF RARE  RARE   WBC, UA 0-2  <3 WBC/hpf   RBC / HPF TOO NUMEROUS TO COUNT  <3 RBC/hpf   Urine-Other URINALYSIS PERFORMED ON SUPERNATANT    CBC     Status: Abnormal   Collection Time    12/29/12  1:26 AM      Result Value Range   WBC 11.1 (*) 4.0 - 10.5 K/uL   RBC 3.84 (*) 3.87 - 5.11 MIL/uL   Hemoglobin 11.0 (*) 12.0 - 15.0 g/dL   HCT 45.4 (*) 09.8 - 11.9 %   MCV 87.8  78.0 - 100.0 fL   MCH 28.6  26.0 - 34.0 pg   MCHC 32.6  30.0 - 36.0 g/dL   RDW 14.7  82.9 - 56.2 %   Platelets 193  150 - 400 K/uL  HCG, QUANTITATIVE, PREGNANCY     Status: Abnormal   Collection Time    12/29/12  2:25 AM      Result Value Range   hCG, Beta Chain, Quant, S 107 (*) <5 mIU/mL  URINALYSIS, ROUTINE W REFLEX MICROSCOPIC     Status: Abnormal   Collection Time    12/29/12  3:10 AM      Result Value Range   Color, Urine YELLOW  YELLOW    APPearance CLEAR  CLEAR   Specific Gravity, Urine 1.020  1.005 - 1.030   pH 6.5  5.0 - 8.0   Glucose, UA NEGATIVE  NEGATIVE mg/dL   Hgb urine dipstick LARGE (*) NEGATIVE   Bilirubin Urine NEGATIVE  NEGATIVE   Ketones, ur NEGATIVE  NEGATIVE mg/dL   Protein, ur NEGATIVE  NEGATIVE mg/dL   Urobilinogen, UA 1.0  0.0 - 1.0 mg/dL   Nitrite NEGATIVE  NEGATIVE   Leukocytes, UA TRACE (*) NEGATIVE  URINE MICROSCOPIC-ADD ON     Status: None   Collection Time    12/29/12  3:10 AM      Result Value Range   Squamous Epithelial / LPF RARE  RARE   WBC, UA 0-2  <3 WBC/hpf   RBC / HPF 3-6  <3 RBC/hpf   Urine-Other MUCOUS PRESENT    RAPID STREP SCREEN     Status: None   Collection Time    12/29/12  3:16 AM      Result Value Range   Streptococcus, Group A Screen (Direct) NEGATIVE  NEGATIVE    IMAGING US Transvaginal Non-ob  12/29/2012  *RADIOLOGY REPORT*  Clinical Data: Status post therapeutic abortion 12/01/2012. Vaginal bleeding.  TRANSABDOMINAL AND TRANSVAGINAL ULTRASOUND OF PELVIS Technique:  Both transabdominal and transvaginal ultrasound examinations of the pelvis were performed. Transabdominal technique was performed for global imaging of the pelvis including uterus, ovaries, adnexal regions, and pelvic cul-de-sac.  It was necessary to proceed with endovaginal exam following the transabdominal exam to visualize the endometrium.  Comparison:  None  Findings:  Uterus: Normal in size and appearance  Endometrium: The endometrium is thickened at 19 mm with heterogeneous material present and flow on Doppler imaging most compatible with retained products of conception.  Right ovary:  Measures 4.2 x 3.6 x 3.6 cm.  There is a cyst measuring 2.2 x 2.1 x 3.0 cm with lacy internal echoes most compatible with a hemorrhagic cyst.  Left ovary: Normal appearance/no adnexal mass  Other findings: No free fluid  IMPRESSION:  1.  Findings  compatible with retained products of conception. 2.  Cystic lesion in the right  ovary has an appearance most consistent with a hemorrhagic cyst.   Original Report Authenticated By: Holley Dexter, M.D.    US Pelvis Complete  12/29/2012  *RADIOLOGY REPORT*  Clinical Data: Status post therapeutic abortion 12/01/2012. Vaginal bleeding.  TRANSABDOMINAL AND TRANSVAGINAL ULTRASOUND OF PELVIS Technique:  Both transabdominal and transvaginal ultrasound examinations of the pelvis were performed. Transabdominal technique was performed for global imaging of the pelvis including uterus, ovaries, adnexal regions, and pelvic cul-de-sac.  It was necessary to proceed with endovaginal exam following the transabdominal exam to visualize the endometrium.  Comparison:  None  Findings:  Uterus: Normal in size and appearance  Endometrium: The endometrium is thickened at 19 mm with heterogeneous material present and flow on Doppler imaging most compatible with retained products of conception.  Right ovary:  Measures 4.2 x 3.6 x 3.6 cm.  There is a cyst measuring 2.2 x 2.1 x 3.0 cm with lacy internal echoes most compatible with a hemorrhagic cyst.  Left ovary: Normal appearance/no adnexal mass  Other findings: No free fluid  IMPRESSION:  1.  Findings compatible with retained products of conception. 2.  Cystic lesion in the right ovary has an appearance most consistent with a hemorrhagic cyst.   Original Report Authenticated By: Holley Dexter, M.D.    MAU COURSE      Early Intrauterine Pregnancy Failure  __x_  Documented intrauterine pregnancy failure less than or equal to [redacted] weeks gestation  __x_  No serious current illness  __x_  Baseline Hgb greater than or equal to 10g/dl  _x__  Patient has easily accessible transportation to the hospital  __x_  Clear preference  _x__  Practitioner/physician deems patient reliable  __x_  Counseling by practitioner or physician  __x_  Patient education by RN  ___  Consent form signed  ___  Rho-Gam given by RN if indicated  ___ Medication  dispensed   ___   Cytotec 800 mcg  __   Intravaginally by patient at home         _x_   Intravaginally by RN in MAU        __   Rectally by patient at home        __   Rectally by RN in MAU  __x_  Ibuprofen 600 mg 1 tablet by mouth every 6 hours as needed #30  ___  Hydrocodone/acetaminophen 5/325 mg by mouth every 4 to 6 hours as needed  __x_  Phenergan 12.5 mg by mouth every 4 hours as needed for nausea   ASSESSMENT 1. Retained products of conception following abortion   2. UTI (lower urinary tract infection)   3. Pharyngitis, acute     PLAN Discharge home     Follow-up Information   Follow up with [facility] In 2 weeks. (for follow up)    Contact information:   [address] [place] [address] [phone]      Follow up with THE [facility] OF [place] MATERNITY ADMISSIONS. (As needed for heavy bleeding, fever, greater than 100.4 or severe pain. )    Contact information:   [address] [place] [address] [phone]       Medication List    TAKE these medications       cephALEXin 500 MG capsule  Commonly known as:  KEFLEX  Take 1 capsule (500 mg total) by mouth 4 (four) times daily.  ibuprofen 600 MG tablet  Commonly known as:  ADVIL,MOTRIN  Take 1 tablet (600 mg total) by mouth every 6 (six) hours as needed for pain.     NYQUIL PO  Take by mouth.     promethazine 25 MG tablet  Commonly known as:  PHENERGAN  Take 1 tablet (25 mg total) by mouth every 6 (six) hours as needed for nausea.         [place], CNM 12/29/2012  8:27 AM

## 2013-01-02 ENCOUNTER — Encounter (HOSPITAL_COMMUNITY): Payer: Self-pay | Admitting: *Deleted

## 2013-01-02 ENCOUNTER — Inpatient Hospital Stay (HOSPITAL_COMMUNITY): Payer: Medicaid Other

## 2013-01-02 ENCOUNTER — Inpatient Hospital Stay (HOSPITAL_COMMUNITY)
Admission: AD | Admit: 2013-01-02 | Discharge: 2013-01-03 | Disposition: A | Discharge status: Home / Self Care | Payer: Medicaid Other | Source: Ambulatory Visit | Attending: Obstetrics & Gynecology | Admitting: Obstetrics & Gynecology

## 2013-01-02 DIAGNOSIS — O071 Delayed or excessive hemorrhage following failed attempted termination of pregnancy: Secondary | ICD-10-CM | POA: Insufficient documentation

## 2013-01-02 LAB — CBC WITH DIFFERENTIAL/PLATELET
Eosinophils Relative: 2 % (ref 0–5)
HCT: 30.8 % — ABNORMAL LOW (ref 36.0–46.0)
Lymphs Abs: 2.7 10*3/uL (ref 0.7–4.0)
MCH: 28.2 pg (ref 26.0–34.0)
MCV: 87.7 fL (ref 78.0–100.0)
Monocytes Absolute: 0.7 10*3/uL (ref 0.1–1.0)
Neutro Abs: 6.4 10*3/uL (ref 1.7–7.7)
Platelets: 233 10*3/uL (ref 150–400)
RBC: 3.51 MIL/uL — ABNORMAL LOW (ref 3.87–5.11)
RDW: 13.6 % (ref 11.5–15.5)

## 2013-01-02 LAB — TYPE AND SCREEN: ABO/RH(D): O POS

## 2013-01-02 MED ORDER — METHYLERGONOVINE MALEATE 0.2 MG PO TABS
0.2000 mg | ORAL_TABLET | Freq: Once | ORAL | Status: AC
  Administered 2013-01-02: 0.2 mg via ORAL
  Filled 2013-01-02: qty 1

## 2013-01-02 MED ORDER — MORPHINE SULFATE 4 MG/ML IJ SOLN
4.0000 mg | Freq: Once | INTRAMUSCULAR | Status: AC
  Administered 2013-01-02: 4 mg via INTRAVENOUS
  Filled 2013-01-02: qty 1

## 2013-01-02 MED ORDER — LACTATED RINGERS IV BOLUS (SEPSIS)
1000.0000 mL | Freq: Once | INTRAVENOUS | Status: AC
  Administered 2013-01-02: 1000 mL via INTRAVENOUS

## 2013-01-02 NOTE — MAU Note (Signed)
Pt. Came in by EMS, with large amount of bleeding. Had an abortion in April. Pt. States that she had a large amount of bleeding last week and was seen in MAU.

## 2013-01-02 NOTE — MAU Provider Note (Signed)
Chief Complaint: No chief complaint on file.   First Provider Initiated Contact with Patient 01/02/13 2106     SUBJECTIVE HPI: Eileen Mcfarland is a 22 y.o. G3P0011 at month S/P elective abortion w/ cytotec who arrived by EMS w/ heavy vaginal bleeding, passing clots and cramping 7/10 on pain scale. Was seen in MAU 12/29/12 for heavy bleeding, but bleeding was not heavy dueing MAU visit. CBC was stable and US showed significant retained POC. Discussed expectant management, vs repeat cytotec vs D&C. Pt elected for cyctotec. Bleeding was light for 3-4 days afterward and then got heavy yesterday. Denies fever, chills, dizziness, syncope. Unsure if she has passed tissue.   Past Medical History  Diagnosis Date  . Urinary tract infection   . Chlamydia   . Infection    OB History   Grav Para Term Preterm Abortions TAB SAB Ect Mult Living   3    1 1    1      # Outc Date GA Lbr Len/2nd Wgt Sex Del Anes PTL Lv   1 TAB 10/08           2 GRA 5/12    F    Yes   3 CUR              Past Surgical History  Procedure Laterality Date  . Hernia repair      as a child   History   Social History  . Marital Status: Single    Spouse Name: N/A    Number of Children: N/A  . Years of Education: N/A   Occupational History  . Not on file.   Social History Main Topics  . Smoking status: Current Some Day Smoker -- 3.00 packs/day    Types: Cigars  . Smokeless tobacco: Not on file  . Alcohol Use: Yes     Comment: socially  . Drug Use: No  . Sexually Active: Yes    Birth Control/ Protection: None   Other Topics Concern  . Not on file   Social History Narrative  . No narrative on file   No current facility-administered medications on file prior to encounter.   Current Outpatient Prescriptions on File Prior to Encounter  Medication Sig Dispense Refill  . cephALEXin (KEFLEX) 500 MG capsule Take 1 capsule (500 mg total) by mouth 4 (four) times daily.  28 capsule  0  . ibuprofen (ADVIL,MOTRIN) 600 MG  tablet Take 1 tablet (600 mg total) by mouth every 6 (six) hours as needed for pain.  30 tablet  1  . promethazine (PHENERGAN) 25 MG tablet Take 1 tablet (25 mg total) by mouth every 6 (six) hours as needed for nausea.  30 tablet  0  . Pseudoeph-Doxylamine-DM-APAP (NYQUIL PO) Take by mouth.       No Known Allergies  ROS: Pertinent items in HPI  OBJECTIVE Blood pressure 146/88, pulse 110, resp. rate 18, height 5\' 3"  (1.6 m), weight 49.896 kg (110 lb), last menstrual period 10/11/2012. Patient Vitals for the past 24 hrs:  BP Pulse Resp Height Weight  01/03/13 0022 118/81 mmHg 89 20 - -  01/02/13 2330 146/88 mmHg 110 - - -  01/02/13 2328 141/84 mmHg 89 - - -  01/02/13 2325 137/89 mmHg 87 - - -  01/02/13 2310 130/90 mmHg 88 - - -  01/02/13 2309 130/90 mmHg 88 18 - -  01/02/13 2239 124/76 mmHg 92 - - -  01/02/13 2115 135/95 mmHg 90 20 5\' 3"  (1.6  m) 49.896 kg (110 lb)   GENERAL: Well-developed, well-nourished female in no acute distress. Normal color for  HEENT: Normocephalic HEART: normal rate RESP: normal effort ABDOMEN: Soft, non-tender EXTREMITIES: Nontender, no edema NEURO: Alert and oriented SPECULUM EXAM: NEFG, large blood clot and tissue w/ moderate amount of red, watery blood noted, small amount of bleeding following passage of tissue/clot. Cervix clean BIMANUAL: cervix closed; uterus normal size, mild CMT.  LAB RESULTS Results for orders placed during the hospital encounter of 01/02/13 (from the past 24 hour(s))  HCG, QUANTITATIVE, PREGNANCY     Status: Abnormal   Collection Time    01/02/13  9:20 PM      Result Value Range   hCG, Beta Chain, Quant, S 41 (*) <5 mIU/mL  CBC WITH DIFFERENTIAL     Status: Abnormal   Collection Time    01/02/13  9:20 PM      Result Value Range   WBC 10.0  4.0 - 10.5 K/uL   RBC 3.51 (*) 3.87 - 5.11 MIL/uL   Hemoglobin 9.9 (*) 12.0 - 15.0 g/dL   HCT 91.4 (*) 78.2 - 95.6 %   MCV 87.7  78.0 - 100.0 fL   MCH 28.2  26.0 - 34.0 pg   MCHC 32.1   30.0 - 36.0 g/dL   RDW 21.3  08.6 - 57.8 %   Platelets 233  150 - 400 K/uL   Neutrophils Relative % 64  43 - 77 %   Lymphocytes Relative 27  12 - 46 %   Monocytes Relative 7  3 - 12 %   Eosinophils Relative 2  0 - 5 %   Basophils Relative 0  0 - 1 %   Neutro Abs 6.4  1.7 - 7.7 K/uL   Lymphs Abs 2.7  0.7 - 4.0 K/uL   Monocytes Absolute 0.7  0.1 - 1.0 K/uL   Eosinophils Absolute 0.2  0.0 - 0.7 K/uL   Basophils Absolute 0.0  0.0 - 0.1 K/uL  TYPE AND SCREEN     Status: None   Collection Time    01/02/13  9:20 PM      Result Value Range   ABO/RH(D) O POS     Antibody Screen NEG     Sample Expiration 01/05/2013      IMAGING US Transvaginal Non-ob  12/29/2012   *RADIOLOGY REPORT*  Clinical Data: Status post therapeutic abortion 12/01/2012. Vaginal bleeding.  TRANSABDOMINAL AND TRANSVAGINAL ULTRASOUND OF PELVIS Technique:  Both transabdominal and transvaginal ultrasound examinations of the pelvis were performed. Transabdominal technique was performed for global imaging of the pelvis including uterus, ovaries, adnexal regions, and pelvic cul-de-sac.  It was necessary to proceed with endovaginal exam following the transabdominal exam to visualize the endometrium.  Comparison:  None  Findings:  Uterus: Normal in size and appearance  Endometrium: The endometrium is thickened at 19 mm with heterogeneous material present and flow on Doppler imaging most compatible with retained products of conception.  Right ovary:  Measures 4.2 x 3.6 x 3.6 cm.  There is a cyst measuring 2.2 x 2.1 x 3.0 cm with lacy internal echoes most compatible with a hemorrhagic cyst.  Left ovary: Normal appearance/no adnexal mass  Other findings: No free fluid  IMPRESSION:  1.  Findings compatible with retained products of conception. 2.  Cystic lesion in the right ovary has an appearance most consistent with a hemorrhagic cyst.   Original Report Authenticated By: Holley Dexter, M.D.   US Pelvis Complete  12/29/2012    *  RADIOLOGY REPORT*  Clinical Data: Status post therapeutic abortion 12/01/2012. Vaginal bleeding.  TRANSABDOMINAL AND TRANSVAGINAL ULTRASOUND OF PELVIS Technique:  Both transabdominal and transvaginal ultrasound examinations of the pelvis were performed. Transabdominal technique was performed for global imaging of the pelvis including uterus, ovaries, adnexal regions, and pelvic cul-de-sac.  It was necessary to proceed with endovaginal exam following the transabdominal exam to visualize the endometrium.  Comparison:  None  Findings:  Uterus: Normal in size and appearance  Endometrium: The endometrium is thickened at 19 mm with heterogeneous material present and flow on Doppler imaging most compatible with retained products of conception.  Right ovary:  Measures 4.2 x 3.6 x 3.6 cm.  There is a cyst measuring 2.2 x 2.1 x 3.0 cm with lacy internal echoes most compatible with a hemorrhagic cyst.  Left ovary: Normal appearance/no adnexal mass  Other findings: No free fluid  IMPRESSION:  1.  Findings compatible with retained products of conception. 2.  Cystic lesion in the right ovary has an appearance most consistent with a hemorrhagic cyst.   Original Report Authenticated By: Holley Dexter, M.D.    MAU COURSE Minimal bleeding after spec exam. No dizziness.   ASSESSMENT 1. Retained products of conception with hemorrhage    PLAN Discharge home     Follow-up Information   Follow up with [facility] In 1 week.   Contact information:   [address] [place] [address] [phone]      Follow up with THE [facility] OF [place] MATERNITY ADMISSIONS. (As needed if symptoms worsen)    Contact information:   [address] [place] [address] [phone]       Medication List    TAKE these medications       cephALEXin 500 MG capsule  Commonly known as:  KEFLEX  Take 1 capsule (500 mg total) by mouth 4 (four) times daily.     ibuprofen 600 MG tablet   Commonly known as:  ADVIL,MOTRIN  Take 1 tablet (600 mg total) by mouth every 6 (six) hours as needed for pain.     methylergonovine 0.2 MG tablet  Commonly known as:  METHERGINE  Take 1 tablet (0.2 mg total) by mouth every 6 (six) hours x 48 hours.     NYQUIL PO  Take by mouth.     oxyCODONE-acetaminophen 5-325 MG per tablet  Commonly known as:  ROXICET  Take 1 tablet by mouth every 6 (six) hours as needed for pain.     promethazine 25 MG tablet  Commonly known as:  PHENERGAN  Take 1 tablet (25 mg total) by mouth every 6 (six) hours as needed for nausea.       [place], CNM 01/03/2013  12:19 AM

## 2013-01-03 MED ORDER — OXYCODONE-ACETAMINOPHEN 5-325 MG PO TABS: 1.0000 | ORAL_TABLET | Freq: Four times a day (QID) | ORAL | Status: DC | PRN

## 2013-01-03 MED ORDER — METHYLERGONOVINE MALEATE 0.2 MG PO TABS: 0.2000 mg | ORAL_TABLET | Freq: Four times a day (QID) | ORAL | Status: AC

## 2013-01-03 NOTE — MAU Provider Note (Signed)
Attestation of Attending Supervision of Advanced Practitioner (CNM/NP): Evaluation and management procedures were performed by the Advanced Practitioner under my supervision and collaboration.  I have reviewed the Advanced Practitioner's note and chart, and I agree with the management and plan.  HARRAWAY-SMITH, Aul Mangieri 1:36 PM     

## 2013-01-03 NOTE — MAU Provider Note (Signed)
Attestation of Attending Supervision of Advanced Practitioner (CNM/NP): Evaluation and management procedures were performed by the Advanced Practitioner under my supervision and collaboration. I have reviewed the Advanced Practitioner's note and chart, and I agree with the management and plan.  Leoda Smithhart H. 2:50 PM   

## 2013-01-10 ENCOUNTER — Ambulatory Visit (INDEPENDENT_AMBULATORY_CARE_PROVIDER_SITE_OTHER): Payer: Medicaid Other | Admitting: Obstetrics & Gynecology

## 2013-01-10 ENCOUNTER — Encounter: Payer: Self-pay | Admitting: Obstetrics & Gynecology

## 2013-01-10 VITALS — BP 112/69 | HR 92 | Temp 97.2°F | Ht 63.0 in | Wt 108.3 lb

## 2013-01-10 DIAGNOSIS — O039 Complete or unspecified spontaneous abortion without complication: Secondary | ICD-10-CM

## 2013-01-10 DIAGNOSIS — O034 Incomplete spontaneous abortion without complication: Secondary | ICD-10-CM

## 2013-01-10 DIAGNOSIS — Z30013 Encounter for initial prescription of injectable contraceptive: Secondary | ICD-10-CM

## 2013-01-10 DIAGNOSIS — Z3009 Encounter for other general counseling and advice on contraception: Secondary | ICD-10-CM

## 2013-01-10 MED ORDER — MEDROXYPROGESTERONE ACETATE 150 MG/ML IM SUSP: 150.0000 mg | Freq: Once | INTRAMUSCULAR | Status: DC

## 2013-01-10 MED ORDER — MEDROXYPROGESTERONE ACETATE 150 MG/ML IM SUSP
150.0000 mg | INTRAMUSCULAR | Status: DC
  Administered 2013-01-10 – 2013-04-12 (×2): 150 mg via INTRAMUSCULAR

## 2013-01-10 NOTE — Patient Instructions (Signed)

## 2013-01-10 NOTE — Progress Notes (Signed)
Patient ID: Eileen Mcfarland, female   DOB: 02/19/1991, 22 y.o.   MRN: [id number]  Chief Complaint  Patient presents with  . Follow-up    retained POC; currently having cramps and some bleeding    HPI Eileen Mcfarland is a 22 y.o. female.  [id number] Patient's last menstrual period was 10/11/2012. She had Tab 12/01/12 in [place] with medications. She reported to MAU 5/14 with bleeding and retained POC passed. Some bleeding and cramps now, may be menstrual. She wants DMPA today. No contraindications.  HPI  Past Medical History  Diagnosis Date  . Urinary tract infection   . Chlamydia   . Infection     Past Surgical History  Procedure Laterality Date  . Hernia repair      as a child    Family History  Problem Relation Age of Onset  . Hypertension Maternal Uncle     Social History History  Substance Use Topics  . Smoking status: Current Some Day Smoker -- 3.00 packs/day    Types: Cigars  . Smokeless tobacco: Not on file  . Alcohol Use: Yes     Comment: socially    No Known Allergies  Current Outpatient Prescriptions  Medication Sig Dispense Refill  . ibuprofen (ADVIL,MOTRIN) 600 MG tablet Take 1 tablet (600 mg total) by mouth every 6 (six) hours as needed for pain.  30 tablet  1  . methylergonovine (METHERGINE) 0.2 MG tablet Take 1 tablet (0.2 mg total) by mouth every 6 (six) hours.  8 tablet  0  . oxyCODONE-acetaminophen (ROXICET) 5-325 MG per tablet Take 1 tablet by mouth every 6 (six) hours as needed for pain.  15 tablet  0  . promethazine (PHENERGAN) 25 MG tablet Take 1 tablet (25 mg total) by mouth every 6 (six) hours as needed for nausea.  30 tablet  0  . Pseudoeph-Doxylamine-DM-APAP (NYQUIL PO) Take by mouth.       Current Facility-Administered Medications  Medication Dose Route Frequency Provider Last Rate Last Dose  . medroxyPROGESTERone (DEPO-PROVERA) injection 150 mg  150 mg Intramuscular Q90 days Adam Phenix, MD        Review of Systems Review of Systems   Constitutional: Negative for fever.  Gastrointestinal: Negative for abdominal pain.  Genitourinary: Positive for vaginal bleeding and pelvic pain (cramps). Negative for dysuria and vaginal discharge.    Blood pressure 112/69, pulse 92, temperature 97.2 F (36.2 C), temperature source Oral, height 5\' 3"  (1.6 m), weight 108 lb 4.8 oz (49.125 kg), last menstrual period 10/11/2012.  Physical Exam Physical Exam  Constitutional: She appears well-developed. No distress.  Abdominal: Soft. There is no tenderness.  Genitourinary: Vagina normal and uterus normal. No vaginal discharge found.  Scant blood, no CMT, no mass  Skin: Skin is warm and dry.  Psychiatric: She has a normal mood and affect. Her behavior is normal.    Data Reviewed MAU visit  Assessment    S/p passage of retained POC after TAb.      Plan    DMPA 150 mg IM Q3 mo        Pinchos Topel 01/10/2013, 1:31 PM

## 2013-01-16 ENCOUNTER — Encounter: Payer: Medicaid Other | Admitting: Obstetrics & Gynecology

## 2013-04-12 ENCOUNTER — Ambulatory Visit (INDEPENDENT_AMBULATORY_CARE_PROVIDER_SITE_OTHER): Payer: Medicaid Other | Admitting: Obstetrics and Gynecology

## 2013-04-12 ENCOUNTER — Encounter: Payer: Self-pay | Admitting: Obstetrics and Gynecology

## 2013-04-12 VITALS — BP 104/70 | HR 90 | Ht 63.0 in | Wt 107.0 lb

## 2013-04-12 DIAGNOSIS — O039 Complete or unspecified spontaneous abortion without complication: Secondary | ICD-10-CM

## 2013-04-12 DIAGNOSIS — Z3049 Encounter for surveillance of other contraceptives: Secondary | ICD-10-CM

## 2013-04-12 NOTE — Progress Notes (Signed)
Advised patient to use condoms for 2 weeks. Patient states understanding.

## 2013-06-28 ENCOUNTER — Ambulatory Visit (INDEPENDENT_AMBULATORY_CARE_PROVIDER_SITE_OTHER): Payer: Medicaid Other | Admitting: *Deleted

## 2013-06-28 VITALS — BP 126/80 | HR 87 | Wt 116.6 lb

## 2013-06-28 DIAGNOSIS — Z3049 Encounter for surveillance of other contraceptives: Secondary | ICD-10-CM

## 2013-06-28 MED ORDER — MEDROXYPROGESTERONE ACETATE 104 MG/0.65ML ~~LOC~~ SUSP
104.0000 mg | Freq: Once | SUBCUTANEOUS | Status: AC
  Administered 2013-06-28: 104 mg via SUBCUTANEOUS

## 2013-06-28 MED ORDER — MEDROXYPROGESTERONE ACETATE 104 MG/0.65ML ~~LOC~~ SUSP: 104.0000 mg | SUBCUTANEOUS | Status: DC

## 2013-09-04 ENCOUNTER — Inpatient Hospital Stay (HOSPITAL_COMMUNITY)
Admission: AD | Admit: 2013-09-04 | Discharge: 2013-09-04 | Disposition: A | Discharge status: Home / Self Care | Payer: Medicaid Other | Source: Ambulatory Visit | Attending: Obstetrics and Gynecology | Admitting: Obstetrics and Gynecology

## 2013-09-04 ENCOUNTER — Encounter (HOSPITAL_COMMUNITY): Payer: Self-pay | Admitting: *Deleted

## 2013-09-04 DIAGNOSIS — R42 Dizziness and giddiness: Secondary | ICD-10-CM | POA: Insufficient documentation

## 2013-09-04 DIAGNOSIS — R350 Frequency of micturition: Secondary | ICD-10-CM | POA: Insufficient documentation

## 2013-09-04 DIAGNOSIS — N390 Urinary tract infection, site not specified: Secondary | ICD-10-CM | POA: Insufficient documentation

## 2013-09-04 DIAGNOSIS — F17200 Nicotine dependence, unspecified, uncomplicated: Secondary | ICD-10-CM | POA: Insufficient documentation

## 2013-09-04 DIAGNOSIS — R300 Dysuria: Secondary | ICD-10-CM | POA: Insufficient documentation

## 2013-09-04 DIAGNOSIS — M549 Dorsalgia, unspecified: Secondary | ICD-10-CM | POA: Insufficient documentation

## 2013-09-04 HISTORY — DX: Migraine, unspecified, not intractable, without status migrainosus: G43.909

## 2013-09-04 LAB — CBC
HCT: 35 % — ABNORMAL LOW (ref 36.0–46.0)
Hemoglobin: 11.7 g/dL — ABNORMAL LOW (ref 12.0–15.0)
MCH: 28.8 pg (ref 26.0–34.0)
MCHC: 33.4 g/dL (ref 30.0–36.0)
MCV: 86.2 fL (ref 78.0–100.0)
PLATELETS: 242 10*3/uL (ref 150–400)
RBC: 4.06 MIL/uL (ref 3.87–5.11)
RDW: 12.9 % (ref 11.5–15.5)
WBC: 8.7 10*3/uL (ref 4.0–10.5)

## 2013-09-04 LAB — URINE MICROSCOPIC-ADD ON

## 2013-09-04 LAB — URINALYSIS, ROUTINE W REFLEX MICROSCOPIC
Bilirubin Urine: NEGATIVE
Glucose, UA: NEGATIVE mg/dL
KETONES UR: 15 mg/dL — AB
LEUKOCYTES UA: NEGATIVE
NITRITE: POSITIVE — AB
PROTEIN: NEGATIVE mg/dL
Specific Gravity, Urine: >1.03 — ABNORMAL HIGH (ref 1.005–1.030)
UROBILINOGEN UA: 1 mg/dL (ref 0.0–1.0)
pH: 5.5 (ref 5.0–8.0)

## 2013-09-04 LAB — POCT PREGNANCY, URINE: PREG TEST UR: NEGATIVE

## 2013-09-04 MED ORDER — CIPROFLOXACIN HCL 500 MG PO TABS: 500.0000 mg | ORAL_TABLET | Freq: Two times a day (BID) | ORAL | Status: DC

## 2013-09-04 NOTE — MAU Note (Signed)
C/o pain with urination for past 2 months; c/o lower back pain for past 2 weeks; c/o L tenderness in breast with a lump;

## 2013-09-04 NOTE — MAU Provider Note (Signed)
Attestation of Attending Supervision of Advanced Practitioner (CNM/NP): Evaluation and management procedures were performed by the Advanced Practitioner under my supervision and collaboration.  I have reviewed the Advanced Practitioner's note and chart, and I agree with the management and plan.  Austine Wiedeman 09/04/2013 11:54 AM   

## 2013-09-04 NOTE — Discharge Instructions (Signed)
Urinary Tract Infection  Urinary tract infections (UTIs) can develop anywhere along your urinary tract. Your urinary tract is your body's drainage system for removing wastes and extra water. Your urinary tract includes two kidneys, two ureters, a bladder, and a urethra. Your kidneys are a pair of bean-shaped organs. Each kidney is about the size of your fist. They are located below your ribs, one on each side of your spine.  CAUSES  Infections are caused by microbes, which are microscopic organisms, including fungi, viruses, and bacteria. These organisms are so small that they can only be seen through a microscope. Bacteria are the microbes that most commonly cause UTIs.  SYMPTOMS   Symptoms of UTIs may vary by age and gender of the patient and by the location of the infection. Symptoms in young women typically include a frequent and intense urge to urinate and a painful, burning feeling in the bladder or urethra during urination. Older women and men are more likely to be tired, shaky, and weak and have muscle aches and abdominal pain. A fever may mean the infection is in your kidneys. Other symptoms of a kidney infection include pain in your back or sides below the ribs, nausea, and vomiting.  DIAGNOSIS  To diagnose a UTI, your caregiver will ask you about your symptoms. Your caregiver also will ask to provide a urine sample. The urine sample will be tested for bacteria and white blood cells. White blood cells are made by your body to help fight infection.  TREATMENT   Typically, UTIs can be treated with medication. Because most UTIs are caused by a bacterial infection, they usually can be treated with the use of antibiotics. The choice of antibiotic and length of treatment depend on your symptoms and the type of bacteria causing your infection.  HOME CARE INSTRUCTIONS   If you were prescribed antibiotics, take them exactly as your caregiver instructs you. Finish the medication even if you feel better after you  have only taken some of the medication.   Drink enough water and fluids to keep your urine clear or pale yellow.   Avoid caffeine, tea, and carbonated beverages. They tend to irritate your bladder.   Empty your bladder often. Avoid holding urine for long periods of time.   Empty your bladder before and after sexual intercourse.   After a bowel movement, women should cleanse from front to back. Use each tissue only once.  SEEK MEDICAL CARE IF:    You have back pain.   You develop a fever.   Your symptoms do not begin to resolve within 3 days.  SEEK IMMEDIATE MEDICAL CARE IF:    You have severe back pain or lower abdominal pain.   You develop chills.   You have nausea or vomiting.   You have continued burning or discomfort with urination.  MAKE SURE YOU:    Understand these instructions.   Will watch your condition.   Will get help right away if you are not doing well or get worse.  Document Released: 05/18/2005 Document Revised: 02/07/2012 Document Reviewed: 09/16/2011  ExitCare Patient Information 2014 [employer].

## 2013-09-04 NOTE — MAU Provider Note (Signed)
History     CSN: [id number]  Arrival date and time: 09/04/13 [id number]   First Provider Initiated Contact with Patient 09/04/13 1025      Chief Complaint  Patient presents with  . Dysuria  . breast tenderness   . Back Pain   HPI  Eileen Mcfarland is a 23 yo, G3P1021 who present to the MAU for painful urination for 2 months.  Pt reports that pain occurs with every urination and is associated with increased frequency, nausea, dizziness and back pain.  Reports her urine is a dark color and has a foul smell.  States that she has reoccurring UTIs and didn't come in until today because she started to develop back pain.  States the pain is in her lower back.  The is pain is dull, non-radiating at an 8/10.    Past Medical History  Diagnosis Date  . Urinary tract infection   . Chlamydia   . Infection     Past Surgical History  Procedure Laterality Date  . Hernia repair      as a child    Family History  Problem Relation Age of Onset  . Hypertension Maternal Uncle     History  Substance Use Topics  . Smoking status: Current Some Day Smoker -- 3.00 packs/day    Types: Cigars  . Smokeless tobacco: Not on file  . Alcohol Use: Yes     Comment: socially    Allergies: No Known Allergies  Facility-administered medications prior to admission  Medication Dose Route Frequency Provider Last Rate Last Dose  . medroxyPROGESTERone (DEPO-PROVERA) injection 150 mg  150 mg Intramuscular Q90 days Adam PhenixJames G Arnold, MD   150 mg at 04/12/13 1121   Prescriptions prior to admission  Medication Sig Dispense Refill  . ibuprofen (ADVIL,MOTRIN) 600 MG tablet Take 1 tablet (600 mg total) by mouth every 6 (six) hours as needed for pain.  30 tablet  1  . medroxyPROGESTERone (DEPO-PROVERA) 150 MG/ML injection Inject 150 mg into the muscle every 3 (three) months.        Review of Systems  Constitutional: Negative for fever and chills.  Respiratory: Negative for cough.   Gastrointestinal: Positive for  nausea. Negative for vomiting and abdominal pain.  Genitourinary: Positive for dysuria, urgency, frequency and flank pain.   Physical Exam   Blood pressure 123/76, pulse 84, temperature 98.2 F (36.8 C), temperature source Oral, resp. rate 16, height 5\' 2"  (1.575 m), weight 54.432 kg (120 lb).  Physical Exam  Constitutional: She appears well-developed and well-nourished.  HENT:  Head: Normocephalic.  GI: Soft. There is tenderness in the left lower quadrant.  Neurological: She is alert.  Skin: Skin is warm and dry.    MAU Course  Procedures  MDM  Urine Analysis Urine Pregnancy test CBC Rx Ciprofloxacin 500 mg BID for 7 days   Assessment and Plan   Assessment:  #UTI (Urinary tract infection)-Patient currently exhibiting classic signs for a UTI including, dysuria, polyuria, and dark, foul smelling urine.  The new onset of flank pain is concerning for pyelonephritis; however the patient is afebrile, has no white count and is exhibiting mild CVA tenderness.  With the duration of symptoms a progression of cystitis is likely and the patient will be treated with a longer abx regiment to ensure proper coverage.  Plan:  1.  Discharge in stable condition 2.  Take antibiotic regiment as directed on the bottle, Ciprofloxacin 500 mg BID for 7 days 3.  F/u urine culture  4.  Return to the MAU if symptoms worsen and/or persist   Toilolo, Tifi 09/04/2013, 10:36 AM    I have seen this patient and agree with the above PA student's note.  Pt started new insurance with her job and needs a PCP.  Recommend pt see primary care to evaluate for frequent UTIs.  Pt states understanding.  LEFTWICH-KIRBY, LISA Certified Nurse-Midwife

## 2013-09-04 NOTE — MAU Note (Signed)
Pt presents with complaints of back pain, pain with urination, and breast tenderness that started a month ago.

## 2013-09-06 LAB — URINE CULTURE: Colony Count: >=100000

## 2013-09-16 ENCOUNTER — Ambulatory Visit: Payer: PRIVATE HEALTH INSURANCE

## 2013-09-19 ENCOUNTER — Emergency Department (HOSPITAL_COMMUNITY)
Admission: EM | Admit: 2013-09-19 | Discharge: 2013-09-19 | Disposition: A | Discharge status: Home / Self Care | Payer: Medicaid Other | Attending: Emergency Medicine | Admitting: Emergency Medicine

## 2013-09-19 ENCOUNTER — Encounter (HOSPITAL_COMMUNITY): Payer: Self-pay | Admitting: Emergency Medicine

## 2013-09-19 DIAGNOSIS — M7650 Patellar tendinitis, unspecified knee: Secondary | ICD-10-CM | POA: Insufficient documentation

## 2013-09-19 DIAGNOSIS — M25561 Pain in right knee: Secondary | ICD-10-CM

## 2013-09-19 DIAGNOSIS — M7652 Patellar tendinitis, left knee: Secondary | ICD-10-CM

## 2013-09-19 DIAGNOSIS — Z79899 Other long term (current) drug therapy: Secondary | ICD-10-CM | POA: Insufficient documentation

## 2013-09-19 DIAGNOSIS — F17200 Nicotine dependence, unspecified, uncomplicated: Secondary | ICD-10-CM | POA: Insufficient documentation

## 2013-09-19 DIAGNOSIS — M25469 Effusion, unspecified knee: Secondary | ICD-10-CM | POA: Insufficient documentation

## 2013-09-19 MED ORDER — NAPROXEN 500 MG PO TABS: 500.0000 mg | ORAL_TABLET | Freq: Two times a day (BID) | ORAL | Status: DC

## 2013-09-19 NOTE — ED Notes (Signed)
Per pt, states B/L knee pain for a week-painful to bear weight-restless at night

## 2013-09-19 NOTE — ED Provider Notes (Signed)
CSN: [id number]     Arrival date & time 09/19/13  1131 History  This chart was scribed for non-physician practitioner, Jaynie Crumbleatyana Random Dobrowski, PA-C working with Suzi RootsKevin E Steinl, MD by Greggory StallionKayla Andersen, ED scribe. This patient was seen in room WTR5/WTR5 and the patient's care was started at 12:35 PM.   Chief Complaint  Patient presents with  . Knee Pain   The history is provided by the patient. No language interpreter was used.   HPI Comments: Eileen Mcfarland is a 23 y.o. female who presents to the Emergency Department complaining of gradual onset, constant bilateral knee pain, left worse than right, that started one week ago. Denies injury. She states there is mild swelling in her left knee. Bearing weight and certain movements worsen the pain. Pt has taken 600 mg ibuprofen with no relief. She states she stands for long periods at her job. Denies history of knee problems or past injury.   Past Medical History  Diagnosis Date  . Urinary tract infection   . Chlamydia   . Infection   . Migraine    Past Surgical History  Procedure Laterality Date  . Hernia repair      as a child   Family History  Problem Relation Age of Onset  . Hypertension Maternal Uncle    History  Substance Use Topics  . Smoking status: Current Some Day Smoker -- 3.00 packs/day    Types: Cigars  . Smokeless tobacco: Not on file  . Alcohol Use: Yes     Comment: socially   OB History   Grav Para Term Preterm Abortions TAB SAB Ect Mult Living   3 1 1  1 1    1      Review of Systems  Musculoskeletal: Positive for arthralgias and joint swelling.  All other systems reviewed and are negative.   Allergies  Review of patient's allergies indicates no known allergies.  Home Medications   Current Outpatient Rx  Name  Route  Sig  Dispense  Refill  . medroxyPROGESTERone (DEPO-PROVERA) 150 MG/ML injection   Intramuscular   Inject 150 mg into the muscle every 3 (three) months.          BP 135/80  Pulse 108   Temp(Src) 99.1 F (37.3 C) (Oral)  Resp 14  SpO2 100%  Physical Exam  Nursing note and vitals reviewed. Constitutional: She is oriented to person, place, and time. She appears well-developed and well-nourished. No distress.  HENT:  Head: Normocephalic and atraumatic.  Eyes: EOM are normal.  Neck: Neck supple. No tracheal deviation present.  Cardiovascular: Normal rate.   Pulmonary/Chest: Effort normal. No respiratory distress.  Musculoskeletal: Normal range of motion.  Normal appearing knees bilaterally. Full ROM. Pain at full flexion. Right knee non tender to palpation. Left knee tenderness over superior patella tendon. Pain with extension against resistance. Negative anterior and posterior drawer signs bilaterally. No laxity with medial or lateral stress.   Neurological: She is alert and oriented to person, place, and time.  Skin: Skin is warm and dry.  Psychiatric: She has a normal mood and affect. Her behavior is normal.    ED Course  Procedures (including critical care time)  DIAGNOSTIC STUDIES: Oxygen Saturation is 100% on RA, normal by my interpretation.    COORDINATION OF CARE: 12:41 PM-Discussed treatment plan which includes rest, ice and an anti-inflammatory with pt at bedside and pt agreed to plan.   Labs Review Labs Reviewed - No data to display Imaging Review No results found.  EKG Interpretation   None       MDM   1. Patellar tendonitis of left knee   2. Knee pain, right     Pt with bilateral knee pain for over a week. No injuries. No history of knee pain or any other joint in the past. There is no swelling on the exam. No difficulty with ROM, doubt infectious. Pt's job requires her to stand for 8hrs straight and do a lot of squatting and kneeling. I suspect her pain is due to tendonitis based on hx and exam. Cannot exclude arthritidis. At this time, no further imaging indicated. Home with RICE therapy. Restriction from squatting and kneeling. Follow up  with orthopedics.   Filed Vitals:   09/19/13 1141  BP: 135/80  Pulse: 108  Temp: 99.1 F (37.3 C)  Resp: 14    I personally performed the services described in this documentation, which was scribed in my presence. The recorded information has been reviewed and is accurate.   Lottie Mussel, PA-C 09/19/13 1302

## 2013-09-19 NOTE — Discharge Instructions (Signed)
Keep legs elevated when at home. Get a patella strap or wear a knee sleeve. Take naprosyn for pain and inflammation. Ice your knees. Follow up with orthopedics specialist if not improving.    Patellofemoral Pain Your exam shows your knee pain is probably due to a problem with the knee cap, the patella. This problem is also called patellofemoral pain, runner's knee, or chondromalacia. Most of the time, this problem is due to overuse of the knee joint. Repeated bending and straightening can irritate the underside of the knee cap. When this happens, activities such as running, walking, climbing, biking or jumping usually produce pain. Pain may also occur after prolonged sitting. Other patellofemoral symptoms can include joint stiffness, swelling, and a snapping or grinding sensation with movement. Rest and rehabilitation are usually successful in treating this problem. Surgery is rarely needed. Treatment includes correcting any mechanical factors that could hurt the normal working of the knee. This could be weak thigh muscles or foot problems. Avoid repetitive activities of the knee until the pain and other symptoms improve. Apply ice packs over the knee for 20 to 30 minutes every 2 to 4 hours to reduce pain and swelling. Only take over-the-counter or prescription medicines for pain, discomfort, or fever as directed by your caregiver. Knee braces or neoprene sleeves may help reduce irritation. Rehabilitation exercises to strengthen the quad muscle are often prescribed when your symptoms are better. Call your caregiver for a follow-up exam to evaluate your response to treatment. Document Released: 09/15/2004 Document Revised: 10/31/2011 Document Reviewed: 08/08/2005 [facility] Patient Information 2014 [place], [place].

## 2013-09-20 ENCOUNTER — Ambulatory Visit: Payer: Medicaid Other

## 2013-09-22 NOTE — ED Provider Notes (Signed)
Medical screening examination/treatment/procedure(s) were performed by non-physician practitioner and as supervising physician I was immediately available for consultation/collaboration.  EKG Interpretation   None         Suzi RootsKevin E Zandon Talton, MD 09/22/13 1410

## 2013-09-23 ENCOUNTER — Ambulatory Visit (INDEPENDENT_AMBULATORY_CARE_PROVIDER_SITE_OTHER): Payer: Medicaid Other | Admitting: General Practice

## 2013-09-23 VITALS — BP 134/96 | HR 77 | Temp 97.9°F | Ht 63.0 in | Wt 114.4 lb

## 2013-09-23 DIAGNOSIS — Z3049 Encounter for surveillance of other contraceptives: Secondary | ICD-10-CM

## 2013-09-23 MED ORDER — MEDROXYPROGESTERONE ACETATE 104 MG/0.65ML ~~LOC~~ SUSP
104.0000 mg | Freq: Once | SUBCUTANEOUS | Status: AC
  Administered 2013-09-23: 104 mg via SUBCUTANEOUS

## 2013-11-18 ENCOUNTER — Other Ambulatory Visit: Payer: Self-pay | Admitting: Obstetrics

## 2013-11-18 DIAGNOSIS — N6009 Solitary cyst of unspecified breast: Secondary | ICD-10-CM

## 2013-12-16 ENCOUNTER — Ambulatory Visit: Payer: Medicaid Other

## 2013-12-25 ENCOUNTER — Ambulatory Visit: Payer: Medicaid Other

## 2014-03-27 ENCOUNTER — Telehealth: Payer: Self-pay | Admitting: *Deleted

## 2014-03-27 NOTE — Telephone Encounter (Signed)
Eileen Mcfarland called and left a message she has some questions about being off depoprovera since May.  Called Eileen Mcfarland and left a message we are returning your call, please call clinic back.

## 2014-03-28 NOTE — Telephone Encounter (Signed)
Contacted patient and discussed questions about being off Depo Provera, pt states concern over the absence of period since being off of the Depo Provera.  Informed patient that it can take several months for regular periods to resume, but she can take an OTC pregnancy test to rule out pregnancy.  Pt states "I do not feel pregnant".  No other questions, pt verbalizes understanding.

## 2014-04-29 ENCOUNTER — Encounter (HOSPITAL_COMMUNITY): Payer: Self-pay | Admitting: *Deleted

## 2014-04-29 ENCOUNTER — Inpatient Hospital Stay (HOSPITAL_COMMUNITY)
Admission: AD | Admit: 2014-04-29 | Discharge: 2014-04-29 | Disposition: A | Discharge status: Home / Self Care | Payer: Medicaid Other | Source: Ambulatory Visit | Attending: Family Medicine | Admitting: Family Medicine

## 2014-04-29 DIAGNOSIS — F17200 Nicotine dependence, unspecified, uncomplicated: Secondary | ICD-10-CM | POA: Insufficient documentation

## 2014-04-29 DIAGNOSIS — M62838 Other muscle spasm: Secondary | ICD-10-CM | POA: Diagnosis not present

## 2014-04-29 DIAGNOSIS — M25559 Pain in unspecified hip: Secondary | ICD-10-CM | POA: Insufficient documentation

## 2014-04-29 DIAGNOSIS — Z3202 Encounter for pregnancy test, result negative: Secondary | ICD-10-CM | POA: Insufficient documentation

## 2014-04-29 HISTORY — DX: Other specified bacterial agents as the cause of diseases classified elsewhere: B96.89

## 2014-04-29 HISTORY — DX: Gonococcal infection, unspecified: A54.9

## 2014-04-29 HISTORY — DX: Other specified bacterial agents as the cause of diseases classified elsewhere: N76.0

## 2014-04-29 LAB — URINE MICROSCOPIC-ADD ON

## 2014-04-29 LAB — POCT PREGNANCY, URINE: Preg Test, Ur: NEGATIVE

## 2014-04-29 LAB — URINALYSIS, ROUTINE W REFLEX MICROSCOPIC
BILIRUBIN URINE: NEGATIVE
Glucose, UA: NEGATIVE mg/dL
Ketones, ur: 15 mg/dL — AB
NITRITE: NEGATIVE
PROTEIN: NEGATIVE mg/dL
SPECIFIC GRAVITY, URINE: 1.02 (ref 1.005–1.030)
UROBILINOGEN UA: 1 mg/dL (ref 0.0–1.0)
pH: 6 (ref 5.0–8.0)

## 2014-04-29 MED ORDER — NAPROXEN 500 MG PO TABS: 500.0000 mg | ORAL_TABLET | Freq: Two times a day (BID) | ORAL | Status: DC

## 2014-04-29 MED ORDER — CYCLOBENZAPRINE HCL 5 MG PO TABS: 5.0000 mg | ORAL_TABLET | Freq: Three times a day (TID) | ORAL | Status: DC | PRN

## 2014-04-29 MED ORDER — KETOROLAC TROMETHAMINE 30 MG/ML IJ SOLN
30.0000 mg | Freq: Once | INTRAMUSCULAR | Status: AC
  Administered 2014-04-29: 30 mg via INTRAMUSCULAR
  Filled 2014-04-29: qty 1

## 2014-04-29 NOTE — MAU Note (Addendum)
Pt states she has been experiencing bilateral  hip pain which wraps around the side x 1 week.  Unrelieved by Motrin x 4 at 0400 this morning at work.

## 2014-04-29 NOTE — Discharge Instructions (Signed)
Muscle Cramps and Spasms Muscle cramps and spasms are when muscles tighten by themselves. They usually get better within minutes. Muscle cramps are painful. They are usually stronger and last longer than muscle spasms. Muscle spasms may or may not be painful. They can last a few seconds or much longer. HOME CARE  Drink enough fluid to keep your pee (urine) clear or pale yellow.  Massage, stretch, and relax the muscle.  Use a warm towel, heating pad, or warm shower water on tight muscles.  Place ice on the muscle if it is tender or in pain.  Put ice in a plastic bag.  Place a towel between your skin and the bag.  Leave the ice on for 15-20 minutes, 03-04 times a day.  Only take medicine as told by your doctor. GET HELP RIGHT AWAY IF:  Your cramps or spasms get worse, happen more often, or do not get better with time. MAKE SURE YOU:  Understand these instructions.  Will watch your condition.  Will get help right away if you are not doing well or get worse. Document Released: 07/21/2008 Document Revised: 12/03/2012 Document Reviewed: 07/25/2012 ExitCare Patient Information 2015 [employer]. This information is not intended to replace advice given to you by your health care provider. Make sure you discuss any questions you have with your health care provider.  

## 2014-04-29 NOTE — MAU Provider Note (Signed)
  History     CSN: [id number]  Arrival date and time: 04/29/14 1324   First Provider Initiated Contact with Patient 04/29/14 0935      Chief Complaint  Patient presents with  . Hip Pain  . Possible Pregnancy   Hip Pain   Possible Pregnancy Pertinent negatives include no chest pain, chills, coughing, fever, nausea or vomiting.    Back pain: bilateral, lower back pain, does not feel like when she had kidney stones previously during pregnancy.  Pain improved previously with ibuprofen but no longer does. -   No fevers/sweats/chills, no dysuria - no fecal/urinary incontinence, painful with ambulation but no difficulty ambulating, no tingling/numbness  Past Medical History  Diagnosis Date  . Urinary tract infection   . Chlamydia   . Infection   . Migraine   . Gonorrhea   . BV (bacterial vaginosis)     Past Surgical History  Procedure Laterality Date  . Hernia repair      as a child    Family History  Problem Relation Age of Onset  . Hypertension Maternal Uncle     History  Substance Use Topics  . Smoking status: Current Some Day Smoker -- 3.00 packs/day    Types: Cigars  . Smokeless tobacco: Not on file  . Alcohol Use: Yes     Comment: socially    Allergies: No Known Allergies  Facility-administered medications prior to admission  Medication Dose Route Frequency Provider Last Rate Last Dose  . medroxyPROGESTERone (DEPO-PROVERA) injection 150 mg  150 mg Intramuscular Q90 days Adam Phenix, MD   150 mg at 04/12/13 1121   Prescriptions prior to admission  Medication Sig Dispense Refill  . medroxyPROGESTERone (DEPO-PROVERA) 150 MG/ML injection Inject 150 mg into the muscle every 3 (three) months.      . [DISCONTINUED] naproxen (NAPROSYN) 500 MG tablet Take 1 tablet (500 mg total) by mouth 2 (two) times daily.  30 tablet  0    Review of Systems  Constitutional: Negative for fever and chills.  Respiratory: Negative for cough and shortness of breath.    Cardiovascular: Negative for chest pain and leg swelling.  Gastrointestinal: Negative for heartburn, nausea, vomiting and diarrhea.  Genitourinary: Negative for dysuria, urgency, frequency and hematuria.  Musculoskeletal: Positive for back pain. Negative for falls.  Neurological:       No headache   Physical Exam   Blood pressure 126/84, pulse 85, temperature 98.6 F (37 C), temperature source Oral, resp. rate 16, height 5' 2.25" (1.581 m), weight 117 lb 12.8 oz (53.434 kg), SpO2 100.00%.  Physical Exam  Constitutional: She is oriented to person, place, and time. She appears well-developed and well-nourished.  HENT:  Head: Normocephalic and atraumatic.  Eyes: Conjunctivae and EOM are normal.  Neck: Normal range of motion.  Cardiovascular: Normal rate.   Respiratory: Effort normal. No respiratory distress.  Musculoskeletal: Normal range of motion. She exhibits no edema.  No CVA TTP, lumbar paraspinal muscles spasm noted  Neurological: She is alert and oriented to person, place, and time.  Skin: Skin is warm and dry. No erythema.    MAU Course  Procedures  MDM UA dirty catch  Assessment and Plan  Muscle spasm: rx flexeril  #30, naproxen  #30 RF toradol  IM x 1  Kaniel Kiang ROCIO 04/29/2014, 9:43 AM

## 2014-04-29 NOTE — MAU Provider Note (Signed)
Attestation of Attending Supervision of Obstetric Fellow: Evaluation and management procedures were performed by the Obstetric Fellow under my supervision and collaboration.  I have reviewed the Obstetric Fellow's note and chart, and I agree with the management and plan.  Chastin Garlitz, DO Attending Physician Faculty Practice, [facility]  

## 2014-05-20 ENCOUNTER — Telehealth: Payer: Self-pay | Admitting: *Deleted

## 2014-05-20 NOTE — Telephone Encounter (Addendum)
Pt left message stating that she has a question for the nurse.  I returned pt's call and she stated that she has recently stopped taking the Depo Provera injections for birth control. Her last injection was 09/23/13. Since then she has had only one menstrual cycle and it was very light. She is not planning a pregnancy right now and wants an alternate method of birth control.  She has not been using condoms. She asked questions about how she would know she is pregnant if she has symptoms but a home UPT is negative. She has been feeling nauseous and  having multiple mood swings over the past 2 months. She also has gained weight. I advised pt that home UPT tests are very accurate provided the test kit has not expired. They are usually able to detect a pregnancy at 4-[redacted] wks gestation and in some cases even a little earlier. I also told her that it is normal to not have a period or regular periods for up to one year after stopping depo Provera. She will need a clinic appt to discuss birth control options and I will have someone from our scheduling department contact her with that information. Meanwhile, she should use condoms with all intercourse and repeat a pregnancy test if she begins to have breast tenderness combined with additional weight gain.  Pt voiced understanding.

## 2014-06-23 ENCOUNTER — Encounter (HOSPITAL_COMMUNITY): Payer: Self-pay | Admitting: *Deleted

## 2014-06-30 ENCOUNTER — Ambulatory Visit: Payer: Medicaid Other | Admitting: Obstetrics and Gynecology

## 2014-06-30 ENCOUNTER — Telehealth: Payer: Self-pay

## 2014-06-30 NOTE — Telephone Encounter (Signed)
Patient missed appointment for contraception today. Called patient who stated she was sick and would like to reschedule. Informed her front office staff will call with an appointment. Advised she call clinic if she has not heard from clinic by Friday. Patient verbalized understanding. Message sent to admin pool to reschedule patient's appointment.

## 2014-08-27 ENCOUNTER — Encounter (HOSPITAL_COMMUNITY): Payer: Self-pay | Admitting: *Deleted

## 2014-08-27 ENCOUNTER — Ambulatory Visit: Payer: Medicaid Other | Admitting: Obstetrics & Gynecology

## 2014-08-27 ENCOUNTER — Inpatient Hospital Stay (HOSPITAL_COMMUNITY)
Admission: AD | Admit: 2014-08-27 | Discharge: 2014-08-27 | Disposition: A | Discharge status: Home / Self Care | Payer: Self-pay | Source: Ambulatory Visit | Attending: Obstetrics & Gynecology | Admitting: Obstetrics & Gynecology

## 2014-08-27 DIAGNOSIS — F17290 Nicotine dependence, other tobacco product, uncomplicated: Secondary | ICD-10-CM | POA: Insufficient documentation

## 2014-08-27 DIAGNOSIS — B9689 Other specified bacterial agents as the cause of diseases classified elsewhere: Secondary | ICD-10-CM | POA: Insufficient documentation

## 2014-08-27 DIAGNOSIS — N760 Acute vaginitis: Secondary | ICD-10-CM | POA: Insufficient documentation

## 2014-08-27 DIAGNOSIS — A499 Bacterial infection, unspecified: Secondary | ICD-10-CM

## 2014-08-27 LAB — URINE MICROSCOPIC-ADD ON

## 2014-08-27 LAB — URINALYSIS, ROUTINE W REFLEX MICROSCOPIC
Bilirubin Urine: NEGATIVE
GLUCOSE, UA: NEGATIVE mg/dL
KETONES UR: NEGATIVE mg/dL
LEUKOCYTES UA: NEGATIVE
NITRITE: NEGATIVE
Protein, ur: NEGATIVE mg/dL
Specific Gravity, Urine: 1.025 (ref 1.005–1.030)
UROBILINOGEN UA: 0.2 mg/dL (ref 0.0–1.0)
pH: 6 (ref 5.0–8.0)

## 2014-08-27 LAB — WET PREP, GENITAL
Trich, Wet Prep: NONE SEEN
Yeast Wet Prep HPF POC: NONE SEEN

## 2014-08-27 LAB — POCT PREGNANCY, URINE: Preg Test, Ur: NEGATIVE

## 2014-08-27 MED ORDER — METRONIDAZOLE 500 MG PO TABS: 500.0000 mg | ORAL_TABLET | Freq: Two times a day (BID) | ORAL | Status: DC

## 2014-08-27 NOTE — MAU Provider Note (Signed)
History     CSN: [id number]  Arrival date and time: 08/27/14 [id number]   First Provider Initiated Contact with Patient 08/27/14 0416      No chief complaint on file.  HPI Eileen Mcfarland is a 24 y.o. [id number] who presents today with lower abdominal pain, back pain and vaginal discharge. She states that she has had these symptoms since Saturday. Her LMP was 08/07/14. She states that she has had BV in the past, and this seems similar.     Past Medical History  Diagnosis Date  . Urinary tract infection   . Chlamydia   . Infection   . Migraine   . Gonorrhea   . BV (bacterial vaginosis)     Past Surgical History  Procedure Laterality Date  . Hernia repair      as a child    Family History  Problem Relation Age of Onset  . Hypertension Maternal Uncle     History  Substance Use Topics  . Smoking status: Current Some Day Smoker -- 3.00 packs/day    Types: Cigars  . Smokeless tobacco: Not on file  . Alcohol Use: Yes     Comment: socially    Allergies: No Known Allergies  Facility-administered medications prior to admission  Medication Dose Route Frequency Provider Last Rate Last Dose  . medroxyPROGESTERone (DEPO-PROVERA) injection 150 mg  150 mg Intramuscular Q90 days Adam PhenixJames G Arnold, MD   150 mg at 04/12/13 1121   Prescriptions prior to admission  Medication Sig Dispense Refill Last Dose  . cyclobenzaprine (FLEXERIL) 5 MG tablet Take 1 tablet (5 mg total) by mouth 3 (three) times daily as needed for muscle spasms. 30 tablet 0   . ibuprofen (ADVIL,MOTRIN) 200 MG tablet Take 800 mg by mouth every 6 (six) hours as needed for mild pain or moderate pain.   04/29/2014 at Unknown time  . naproxen (NAPROSYN) 500 MG tablet Take 1 tablet (500 mg total) by mouth 2 (two) times daily. 30 tablet 0     ROS Physical Exam   Blood pressure 122/75, pulse 90, temperature 98.9 F (37.2 C), temperature source Oral, resp. rate 18, height 5\' 2"  (1.575 m), weight 55.566 kg (122 lb 8 oz), last  menstrual period 08/07/2014.  Physical Exam  Nursing note and vitals reviewed. Constitutional: She is oriented to person, place, and time. She appears well-developed and well-nourished. No distress.  Cardiovascular: Normal rate.   Respiratory: Effort normal.  GI: Soft. There is no tenderness. There is no rebound.  Genitourinary:   External: no lesion Vagina: small amount of thin, white homogenous discharge Cervix: pink, smooth, no CMT Uterus: NSSC Adnexa: NT   Neurological: She is alert and oriented to person, place, and time.  Skin: Skin is warm and dry.  Psychiatric: She has a normal mood and affect.    MAU Course  Procedures  Results for orders placed or performed during the hospital encounter of 08/27/14 (from the past 24 hour(s))  Urinalysis, Routine w reflex microscopic     Status: Abnormal   Collection Time: 08/27/14  3:07 AM  Result Value Ref Range   Color, Urine YELLOW YELLOW   APPearance CLEAR CLEAR   Specific Gravity, Urine 1.025 1.005 - 1.030   pH 6.0 5.0 - 8.0   Glucose, UA NEGATIVE NEGATIVE mg/dL   Hgb urine dipstick LARGE (A) NEGATIVE   Bilirubin Urine NEGATIVE NEGATIVE   Ketones, ur NEGATIVE NEGATIVE mg/dL   Protein, ur NEGATIVE NEGATIVE mg/dL   Urobilinogen, UA 0.2  0.0 - 1.0 mg/dL   Nitrite NEGATIVE NEGATIVE   Leukocytes, UA NEGATIVE NEGATIVE  Urine microscopic-add on     Status: None   Collection Time: 08/27/14  3:07 AM  Result Value Ref Range   Squamous Epithelial / LPF RARE RARE   WBC, UA 0-2 <3 WBC/hpf   RBC / HPF 0-2 <3 RBC/hpf   Bacteria, UA RARE RARE   Urine-Other YEAST   Pregnancy, urine POC     Status: None   Collection Time: 08/27/14  3:21 AM  Result Value Ref Range   Preg Test, Ur NEGATIVE NEGATIVE  Wet prep, genital     Status: Abnormal   Collection Time: 08/27/14  4:30 AM  Result Value Ref Range   Yeast Wet Prep HPF POC NONE SEEN NONE SEEN   Trich, Wet Prep NONE SEEN NONE SEEN   Clue Cells Wet Prep HPF POC FEW (A) NONE SEEN    WBC, Wet Prep HPF POC FEW (A) NONE SEEN   Patient offered GC/CT. HIV and wet prep. She declines any testing, but a wet prep to check for BV.   Assessment and Plan   1. BV (bacterial vaginosis)      Medication List    TAKE these medications        cyclobenzaprine 5 MG tablet  Commonly known as:  FLEXERIL  Take 1 tablet (5 mg total) by mouth 3 (three) times daily as needed for muscle spasms.     ibuprofen 200 MG tablet  Commonly known as:  ADVIL,MOTRIN  Take 800 mg by mouth every 6 (six) hours as needed for mild pain or moderate pain.     metroNIDAZOLE 500 MG tablet  Commonly known as:  FLAGYL  Take 1 tablet (500 mg total) by mouth 2 (two) times daily.     naproxen 500 MG tablet  Commonly known as:  NAPROSYN  Take 1 tablet (500 mg total) by mouth 2 (two) times daily.       Follow-up Information    Follow up with [facility] [place].   Why:  As needed   Contact information:   [address] [facility] [address] [phone]       Tawnya Crook 08/27/2014, 5:07 AM

## 2014-08-27 NOTE — Discharge Instructions (Signed)
Bacterial Vaginosis Bacterial vaginosis is a vaginal infection that occurs when the normal balance of bacteria in the vagina is disrupted. It results from an overgrowth of certain bacteria. This is the most common vaginal infection in women of childbearing age. Treatment is important to prevent complications, especially in pregnant women, as it can cause a premature delivery. CAUSES  Bacterial vaginosis is caused by an increase in harmful bacteria that are normally present in smaller amounts in the vagina. Several different kinds of bacteria can cause bacterial vaginosis. However, the reason that the condition develops is not fully understood. RISK FACTORS Certain activities or behaviors can put you at an increased risk of developing bacterial vaginosis, including:  Having a new sex partner or multiple sex partners.  Douching.  Using an intrauterine device (IUD) for contraception. Women do not get bacterial vaginosis from toilet seats, bedding, swimming pools, or contact with objects around them. SIGNS AND SYMPTOMS  Some women with bacterial vaginosis have no signs or symptoms. Common symptoms include:  Grey vaginal discharge.  A fishlike odor with discharge, especially after sexual intercourse.  Itching or burning of the vagina and vulva.  Burning or pain with urination. DIAGNOSIS  Your health care provider will take a medical history and examine the vagina for signs of bacterial vaginosis. A sample of vaginal fluid may be taken. Your health care provider will look at this sample under a microscope to check for bacteria and abnormal cells. A vaginal pH test may also be done.  TREATMENT  Bacterial vaginosis may be treated with antibiotic medicines. These may be given in the form of a pill or a vaginal cream. A second round of antibiotics may be prescribed if the condition comes back after treatment.  HOME CARE INSTRUCTIONS   Only take over-the-counter or prescription medicines as  directed by your health care provider.  If antibiotic medicine was prescribed, take it as directed. Make sure you finish it even if you start to feel better.  Do not have sex until treatment is completed.  Tell all sexual partners that you have a vaginal infection. They should see their health care provider and be treated if they have problems, such as a mild rash or itching.  Practice safe sex by using condoms and only having one sex partner. SEEK MEDICAL CARE IF:   Your symptoms are not improving after 3 days of treatment.  You have increased discharge or pain.  You have a fever. MAKE SURE YOU:   Understand these instructions.  Will watch your condition.  Will get help right away if you are not doing well or get worse. FOR MORE INFORMATION  Centers for Disease Control and Prevention, Division of STD Prevention: www.cdc.gov/std American Sexual Health Association (ASHA): www.ashastd.org  Document Released: 08/08/2005 Document Revised: 05/29/2013 Document Reviewed: 03/20/2013 ExitCare Patient Information 2015 [employer]. This information is not intended to replace advice given to you by your health care provider. Make sure you discuss any questions you have with your health care provider.  

## 2014-08-27 NOTE — MAU Note (Signed)
PT SAYS  ABD  PAIN STARTED  ON WED.   HIP PAIN  STARTED  ON  SAT .  NO VAG  BLEEDING.   LAST SEX--  MON.NO BIRTH  CONTROL

## 2014-08-28 LAB — URINE CULTURE: Colony Count: 30000

## 2014-10-13 ENCOUNTER — Ambulatory Visit (INDEPENDENT_AMBULATORY_CARE_PROVIDER_SITE_OTHER): Payer: PRIVATE HEALTH INSURANCE | Admitting: Physician Assistant

## 2014-10-13 VITALS — BP 138/96 | HR 72 | Temp 97.9°F | Resp 18 | Ht 63.5 in | Wt 117.8 lb

## 2014-10-13 DIAGNOSIS — J209 Acute bronchitis, unspecified: Secondary | ICD-10-CM

## 2014-10-13 DIAGNOSIS — R1110 Vomiting, unspecified: Secondary | ICD-10-CM

## 2014-10-13 DIAGNOSIS — IMO0001 Reserved for inherently not codable concepts without codable children: Secondary | ICD-10-CM

## 2014-10-13 DIAGNOSIS — R05 Cough: Secondary | ICD-10-CM

## 2014-10-13 DIAGNOSIS — R030 Elevated blood-pressure reading, without diagnosis of hypertension: Secondary | ICD-10-CM

## 2014-10-13 DIAGNOSIS — R058 Other specified cough: Secondary | ICD-10-CM

## 2014-10-13 LAB — POCT CBC
Granulocyte percent: 62 %G (ref 37–80)
HEMATOCRIT: 40.2 % (ref 37.7–47.9)
Hemoglobin: 12.8 g/dL (ref 12.2–16.2)
Lymph, poc: 4.1 — AB (ref 0.6–3.4)
MCH, POC: 28.1 pg (ref 27–31.2)
MCHC: 31.8 g/dL (ref 31.8–35.4)
MCV: 88.3 fL (ref 80–97)
MID (CBC): 1 — AB (ref 0–0.9)
MPV: 7.6 fL (ref 0–99.8)
POC GRANULOCYTE: 8.4 — AB (ref 2–6.9)
POC LYMPH PERCENT: 30.6 %L (ref 10–50)
POC MID %: 7.4 %M (ref 0–12)
Platelet Count, POC: 294 10*3/uL (ref 142–424)
RBC: 4.54 M/uL (ref 4.04–5.48)
RDW, POC: 13.1 %
WBC: 13.5 10*3/uL — AB (ref 4.6–10.2)

## 2014-10-13 LAB — POCT URINE PREGNANCY: Preg Test, Ur: NEGATIVE

## 2014-10-13 MED ORDER — BENZONATATE 100 MG PO CAPS: 100.0000 mg | ORAL_CAPSULE | Freq: Three times a day (TID) | ORAL | Status: AC | PRN

## 2014-10-13 MED ORDER — AZITHROMYCIN 250 MG PO TABS: ORAL_TABLET | ORAL | Status: DC

## 2014-10-13 MED ORDER — HYDROCOD POLST-CHLORPHEN POLST 10-8 MG/5ML PO LQCR: 5.0000 mL | Freq: Two times a day (BID) | ORAL | Status: DC | PRN

## 2014-10-13 NOTE — Patient Instructions (Signed)
Take antibiotic until finished. Use tessalon for cough during the day. Take cough syrup at night. Drink plenty of fluids and get plenty of rest. Return if symptoms are not improving in 5-7 days.

## 2014-10-13 NOTE — Progress Notes (Signed)
Subjective:    Patient ID: Eileen Mcfarland, female    DOB: 1991-08-21, 24 y.o.   MRN: [id number]  HPI  This is a 24 year old female presenting with cough x 10 days. Started with URI symptoms. Nasal congestion and sore throat resolved. Now has lingering cough that is worsening. Cough is productive. Over past 2-3 days she is experiencing post-tussive emesis. She is unable to sleep d/t coughing. She has been taking dayquil, nyquil, robitussin, alka seltzer, halls - nothing helping. She had fever when illness started but none since. Denies current nasal congestion, SOB or wheezing. Some burning in her throat but states "not that bad". 2 days ago she had an episode of dizziness and nausea . LMP 09/28/14. He had unprotected sex 2 weeks ago. No history of asthma. Current every day smoker - 3 cigars a day.  Pt has elevated BP today to 140/100. She does not have a PCP. She states during her first pregnancy she had elevated BP but none since. She works 3rd shift and hasn't slept yet today.  Review of Systems  Constitutional: Negative for fever and chills.  HENT: Positive for sore throat. Negative for congestion, ear pain and sinus pressure.   Eyes: Positive for redness.  Respiratory: Positive for cough. Negative for shortness of breath and wheezing.   Gastrointestinal: Positive for nausea and vomiting. Negative for abdominal pain and diarrhea.  Skin: Negative for rash.  Neurological: Positive for dizziness. Negative for headaches.  Hematological: Negative for adenopathy.  Psychiatric/Behavioral: Positive for sleep disturbance.    Patient Active Problem List   Diagnosis Date Noted  . Retained products of conception following abortion 01/10/2013  . Initiation of Depo Provera 01/10/2013   Prior to Admission medications   Not on File   No Known Allergies  Patient's social and family history were reviewed.     Objective:   Physical Exam  Constitutional: She is oriented to person, place, and  time. She appears well-developed and well-nourished. No distress.  HENT:  Head: Normocephalic and atraumatic.  Right Ear: Hearing, tympanic membrane, external ear and ear canal normal.  Left Ear: Hearing, tympanic membrane, external ear and ear canal normal.  Nose: Nose normal. Right sinus exhibits no maxillary sinus tenderness and no frontal sinus tenderness. Left sinus exhibits no maxillary sinus tenderness and no frontal sinus tenderness.  Mouth/Throat: Uvula is midline and mucous membranes are normal. Posterior oropharyngeal erythema present. No oropharyngeal exudate, posterior oropharyngeal edema or tonsillar abscesses.  Lacy dark discoloration on roof of mouth, pt unaware if it has always been like that.  Eyes: EOM and lids are normal. Pupils are equal, round, and reactive to light. Right eye exhibits no discharge. Left eye exhibits no discharge. Right conjunctiva is injected. Left conjunctiva is injected. No scleral icterus.  Cardiovascular: Normal rate, regular rhythm, normal heart sounds, intact distal pulses and normal pulses.   No murmur heard. Pulmonary/Chest: Effort normal. No respiratory distress. She has decreased breath sounds (lower lung fields). She has no wheezes. She has no rhonchi. She has no rales.  Musculoskeletal: Normal range of motion.  Lymphadenopathy:    She has cervical adenopathy (left, anterior).  Neurological: She is alert and oriented to person, place, and time.  Skin: Skin is warm, dry and intact. No lesion and no rash noted.  Psychiatric: She has a normal mood and affect. Her speech is normal and behavior is normal. Thought content normal.  BP 138/96 mmHg  Pulse 72  Temp(Src) 97.9 F (36.6 C) (  Oral)  Resp 18  Ht 5' 3.5" (1.613 m)  Wt 117 lb 12.8 oz (53.434 kg)  BMI 20.54 kg/m2  SpO2 100%   Results for orders placed or performed in visit on 10/13/14  POCT CBC  Result Value Ref Range   WBC 13.5 (A) 4.6 - 10.2 K/uL   Lymph, poc 4.1 (A) 0.6 - 3.4   POC  LYMPH PERCENT 30.6 10 - 50 %L   MID (cbc) 1.0 (A) 0 - 0.9   POC MID % 7.4 0 - 12 %M   POC Granulocyte 8.4 (A) 2 - 6.9   Granulocyte percent 62.0 37 - 80 %G   RBC 4.54 4.04 - 5.48 M/uL   Hemoglobin 12.8 12.2 - 16.2 g/dL   HCT, POC 16.1 09.6 - 47.9 %   MCV 88.3 80 - 97 fL   MCH, POC 28.1 27 - 31.2 pg   MCHC 31.8 31.8 - 35.4 g/dL   RDW, POC 04.5 %   Platelet Count, POC 294 142 - 424 K/uL   MPV 7.6 0 - 99.8 fL  POCT urine pregnancy  Result Value Ref Range   Preg Test, Ur Negative       Assessment & Plan:  1. Post-tussive emesis 2. Productive cough 3. Acute bronchitis WBC of 13.5 with elevated lymphs and granulocytes. Will treat for bacterial pathogens with zithromax. Tussionex and tessalon for cough symptoms. She will return if not getting better in 7-10 days.  - POCT urine pregnancy - POCT CBC - azithromycin (ZITHROMAX) 250 MG tablet; Take 2 tabs PO x 1 dose, then 1 tab PO QD x 4 days  Dispense: 6 tablet; Refill: 0 - chlorpheniramine-HYDROcodone (TUSSIONEX PENNKINETIC ER) 10-8 MG/5ML LQCR; Take 5 mLs by mouth every 12 (twelve) hours as needed for cough (cough).  Dispense: 80 mL; Refill: 0 - benzonatate (TESSALON) 100 MG capsule; Take 1-2 capsules (100-200 mg total) by mouth 3 (three) times daily as needed for cough.  Dispense: 40 capsule; Refill: 0  4. Elevated BP We discussed salt intake, diet and exercise. Possible that her BP is elevated d/t illness and lack of sleep. She will return for follow up in 2-3 weeks when she has slept and is feeling better.  Roswell Min[place] Dyke Brackett, [facility]  10/13/2014

## 2014-10-22 ENCOUNTER — Telehealth: Payer: Self-pay

## 2014-10-22 NOTE — Telephone Encounter (Signed)
Patient came in to drop off FMLA paperwork to be done and wants the provider to know that the insurance covered high blood pressure for FMLA but not lung infection. She would like the provider to note that the lung infection is a serious matter. Patient phone [phone]

## 2014-10-23 ENCOUNTER — Telehealth: Payer: Self-pay

## 2014-10-23 NOTE — Telephone Encounter (Signed)
Patient brought in FMLA paperwork to be completed by PA Bush in 5-7 business days please return to disability when completed.

## 2014-10-27 NOTE — Telephone Encounter (Signed)
A copy of FMLA paperwork has been made and scanned to patient's chart.

## 2014-10-27 NOTE — Telephone Encounter (Signed)
Spoke with pt, advised FMLA paperwork ready to be pick up.

## 2014-10-27 NOTE — Telephone Encounter (Signed)
FMLA paperwork completed. Please let pt know.

## 2014-11-07 DIAGNOSIS — Z0271 Encounter for disability determination: Secondary | ICD-10-CM

## 2015-01-17 IMAGING — US US PELVIS COMPLETE
1 series · 14 of 25 positions shown · non-contrast
Comparison: None

CLINICAL DATA: Status post therapeutic abortion 12/01/2012.
Vaginal bleeding.



[Series 1: us transvaginal non-ob · 14 of 53 slices shown]
[im 1/53]
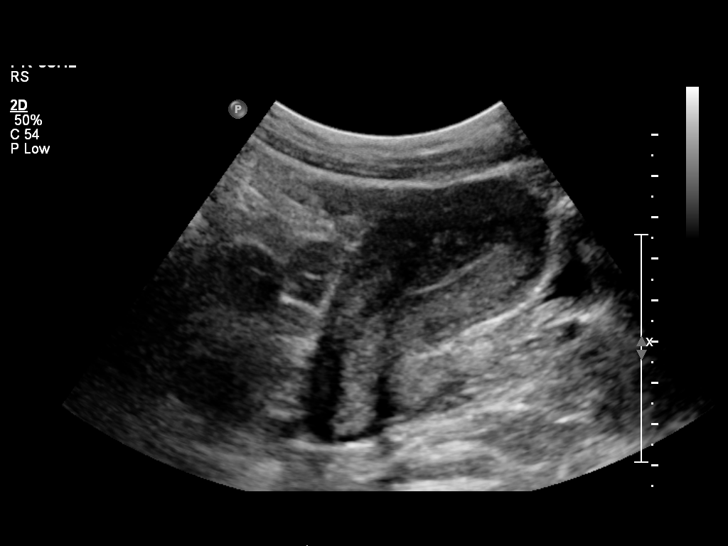
[im 5/53]
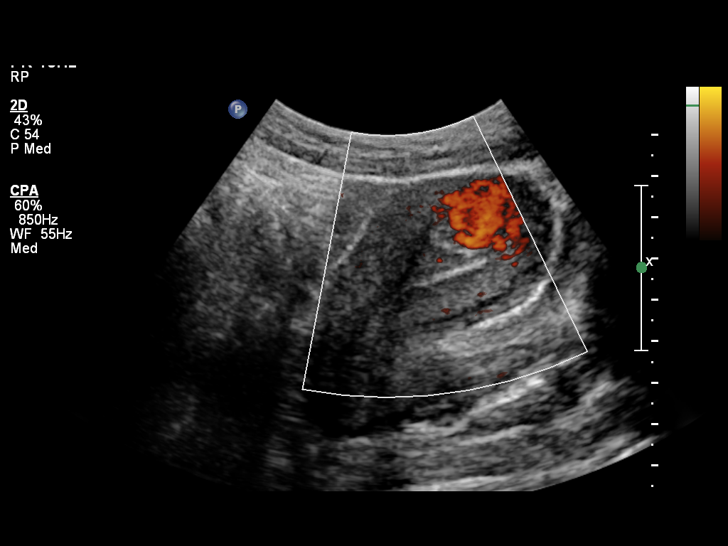
[im 9/53]
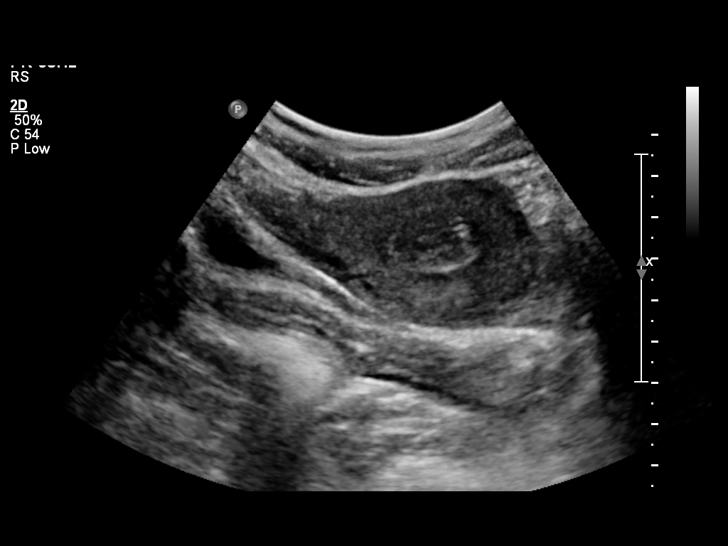
[im 14/53]
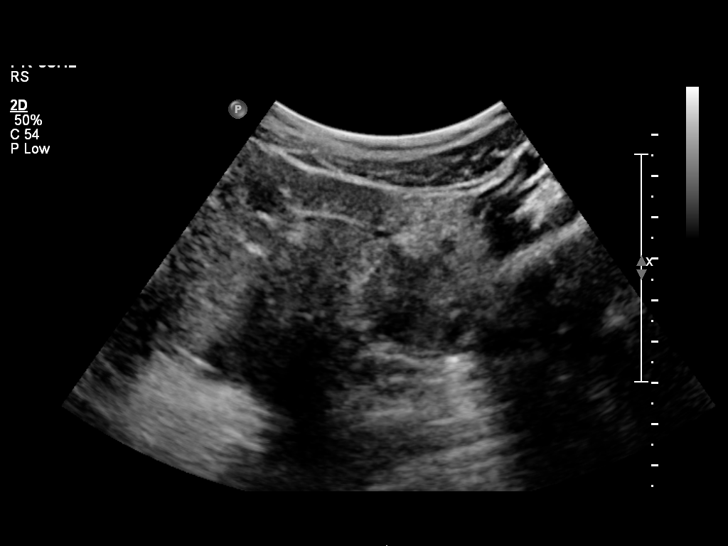
[im 18/53]
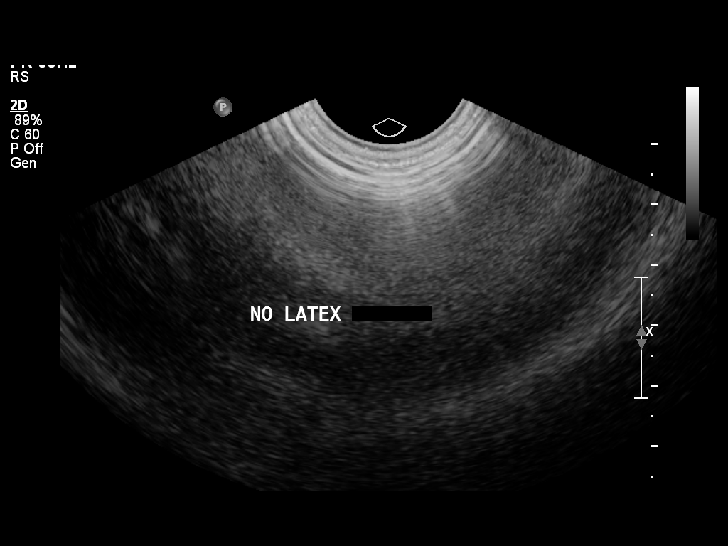
[im 20/53]
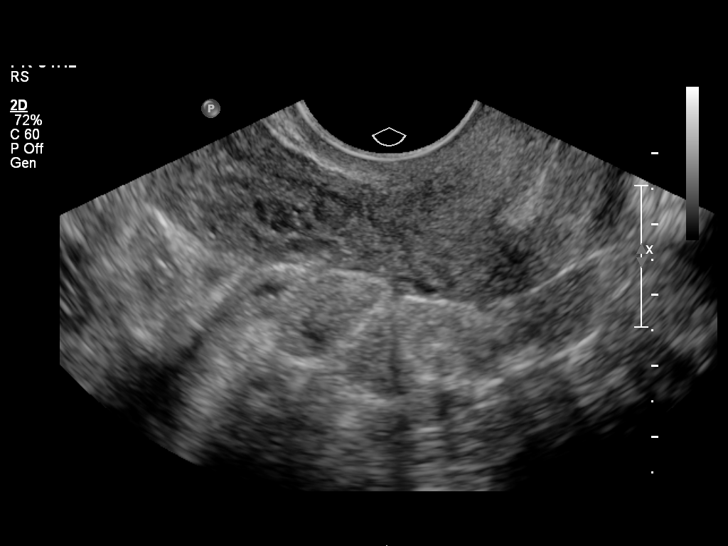
[im 24/53]
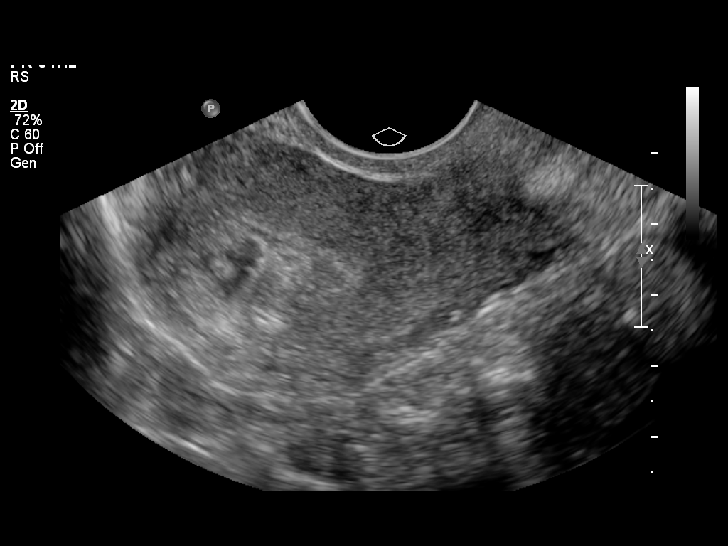
[im 29/53]
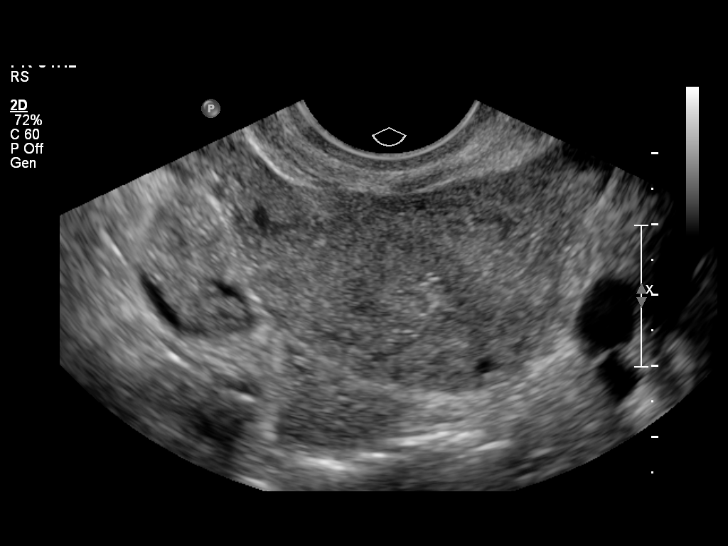
[im 33/53]
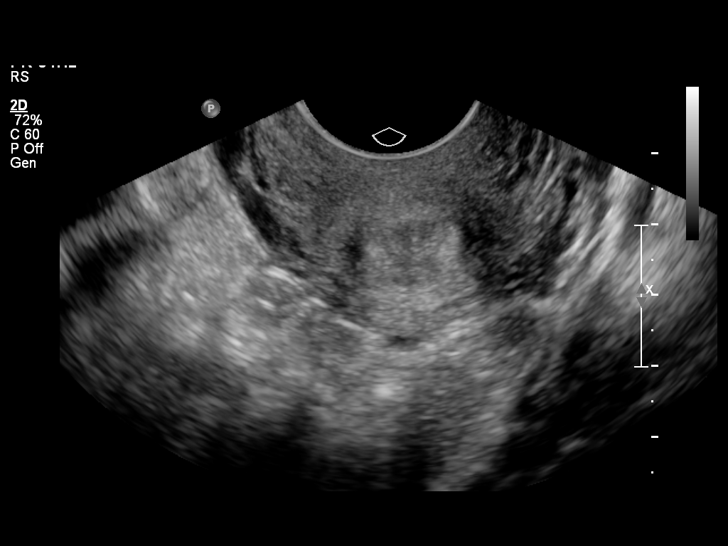
[im 35/53]
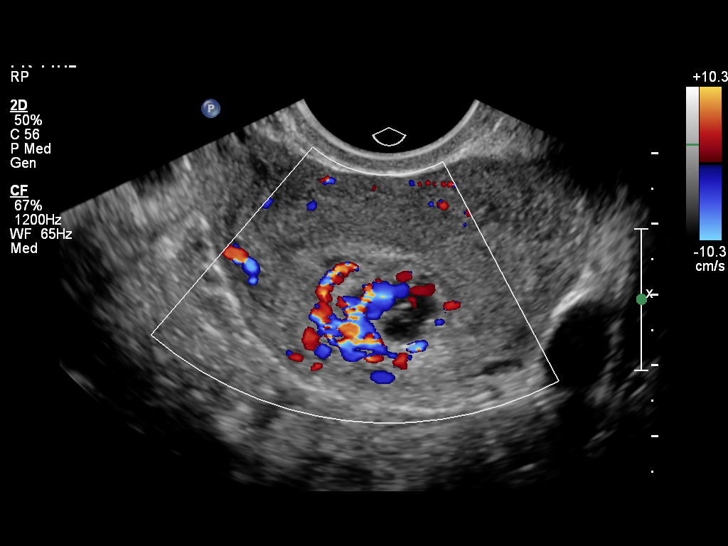
[im 40/53]
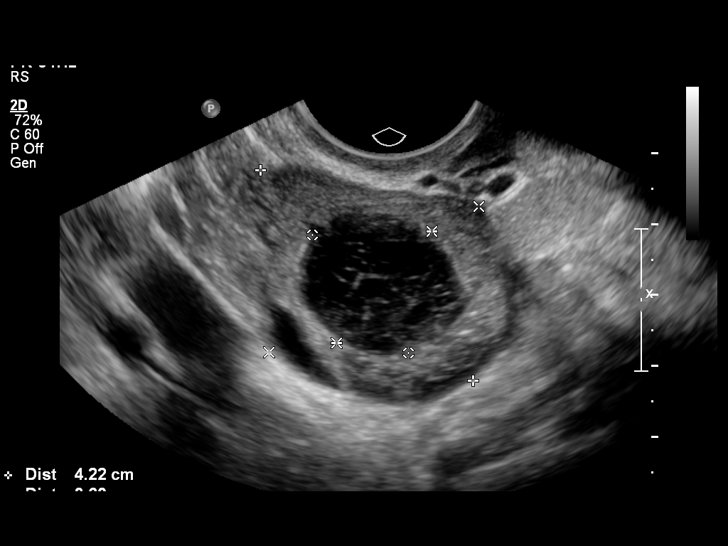
[im 44/53]
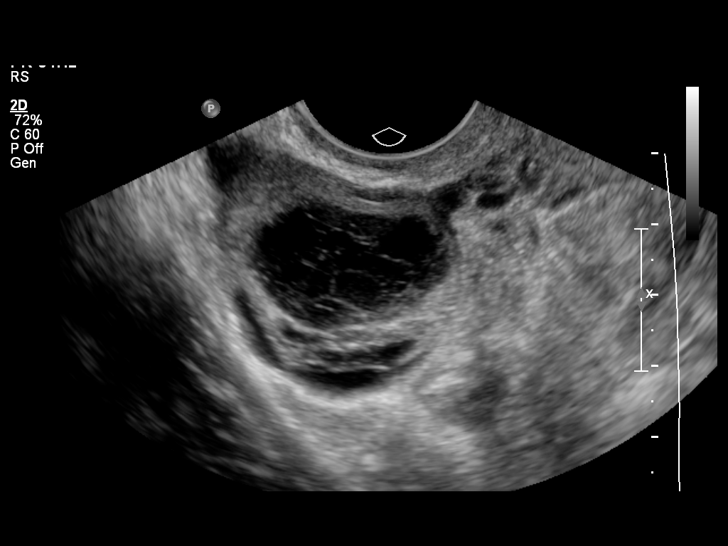
[im 48/53]
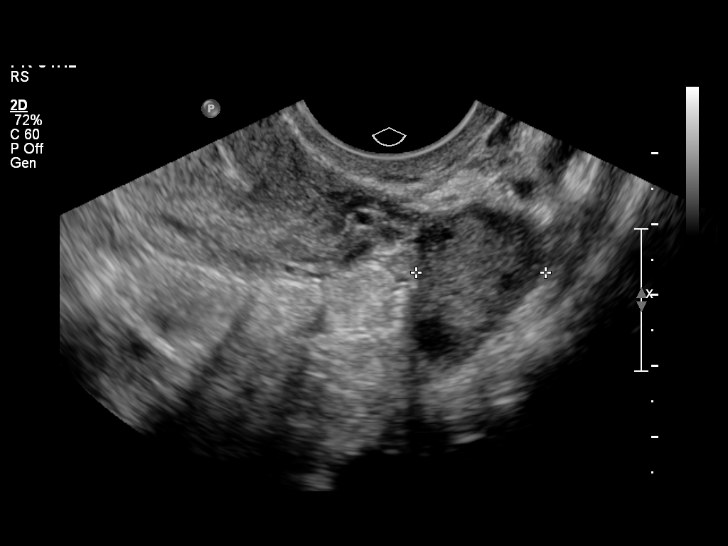
[im 53/53]
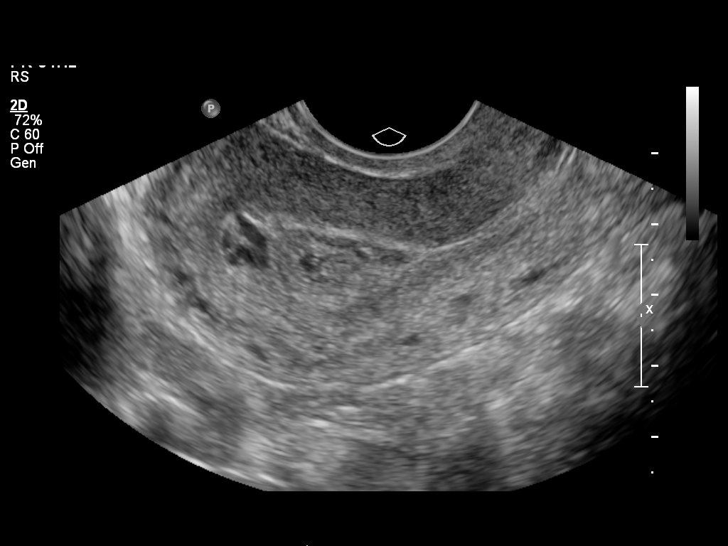

[14 of 25 positions shown; findings below may reference images not displayed]

FINDINGS: Uterus: Normal in size and appearance

Endometrium: The endometrium is thickened at 19 mm with
heterogeneous material present and flow on Doppler imaging most
compatible with retained products of conception.

Right ovary:  Measures 4.2 x 3.6 x 3.6 cm.  There is a cyst
measuring 2.2 x 2.1 x 3.0 cm with Paulus N internal echoes most
compatible with a hemorrhagic cyst.

Left ovary: Normal appearance/no adnexal mass

Other findings: No free fluid
IMPRESSION: 1.  Findings compatible with retained products of conception.
2.  Cystic lesion in the right ovary has an appearance most
consistent with a hemorrhagic cyst..

## 2015-01-21 IMAGING — US US TRANSVAGINAL NON-OB
1 series · 14 of 25 positions shown · non-contrast
Comparison: 12/29/2012

CLINICAL DATA: Heavy bleeding, follow up retained products of
conception.

TRANSVAGINAL ULTRASOUND OF PELVIS
TECHNIQUE: Transvaginal ultrasound examination of the pelvis was
performed including evaluation of the uterus, ovaries, adnexal
regions, and pelvic cul-de-sac.

[Series 1: us transvaginal non-ob · 47 acquisitions, 14 frames shown]
[im 1/47]
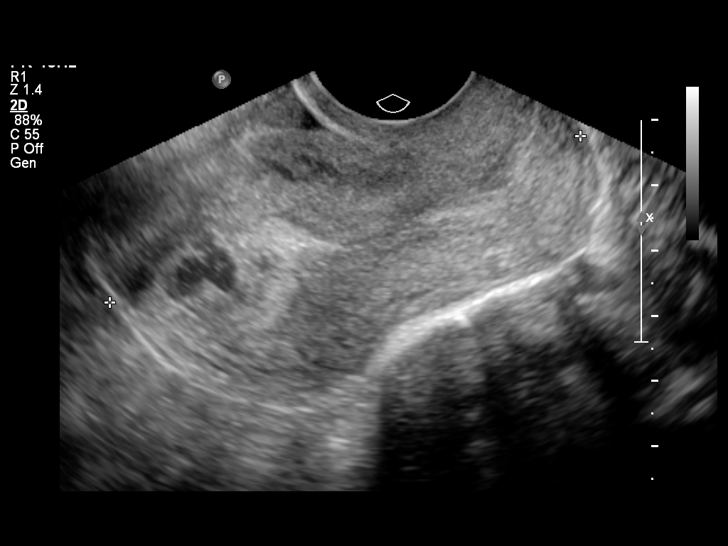
[im 4/47]
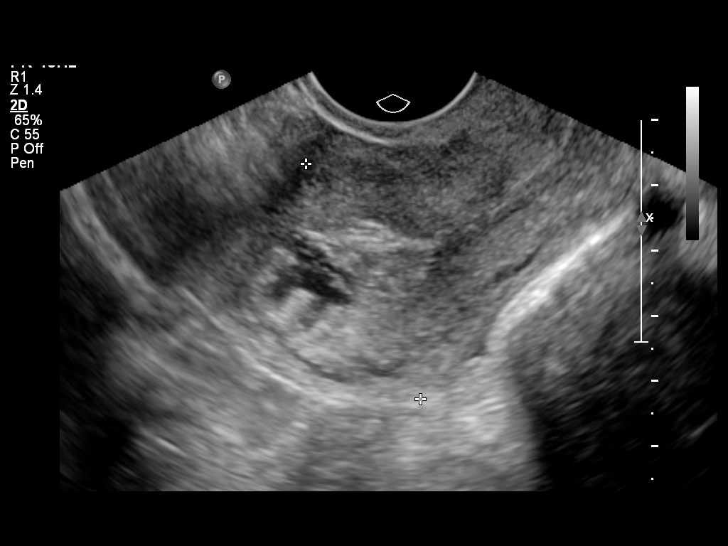
[im 8/47]
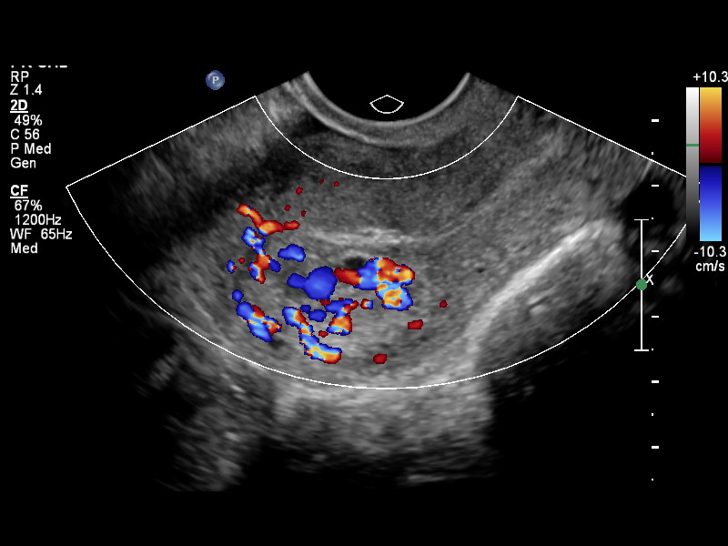
[im 12/47]
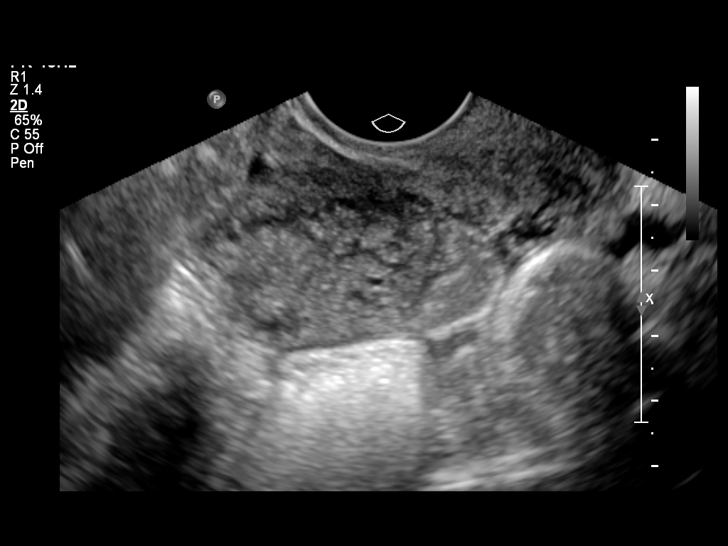
[im 16/47]
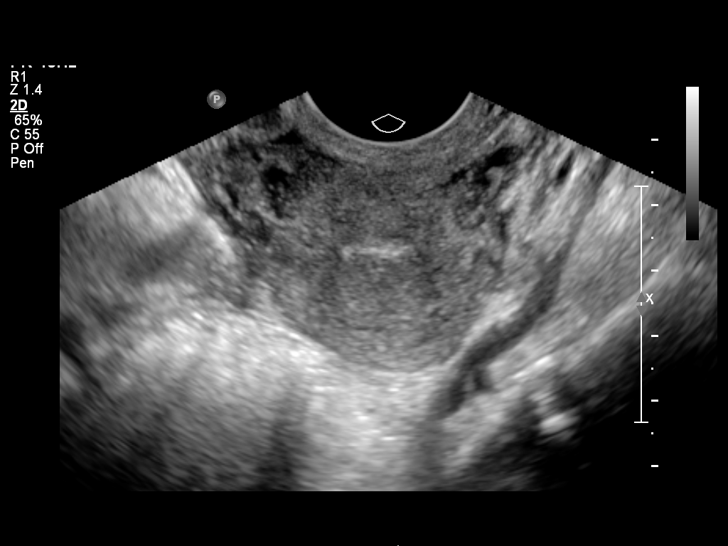
[im 18/47]
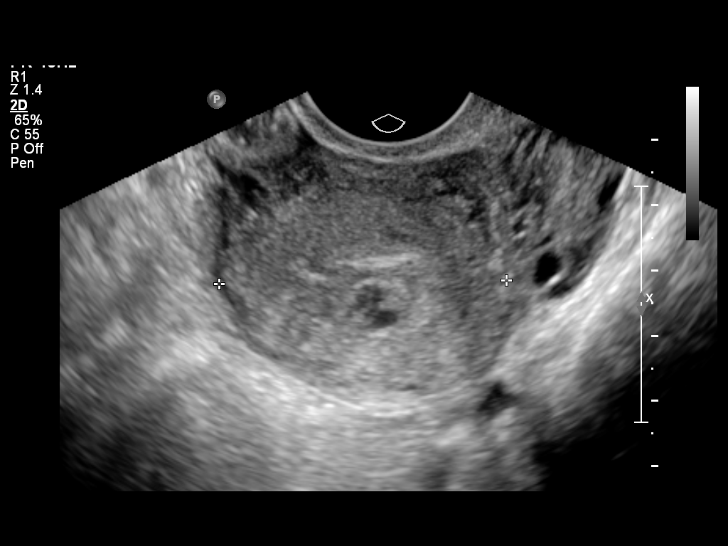
[im 22/47]
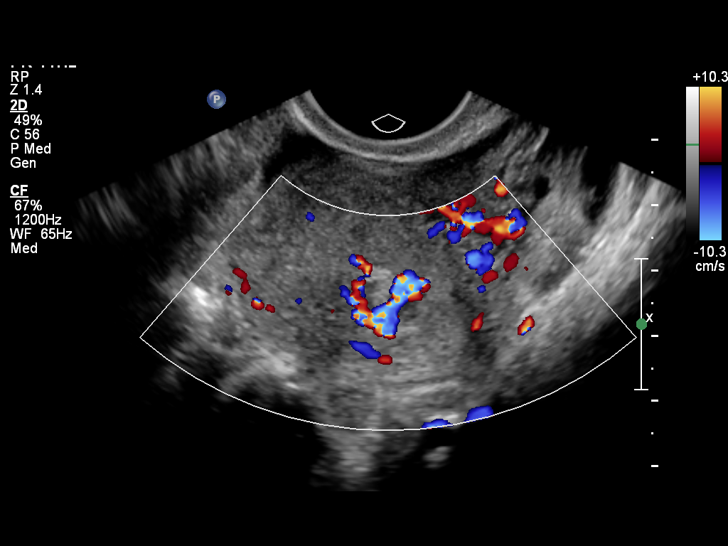
[im 25/47]
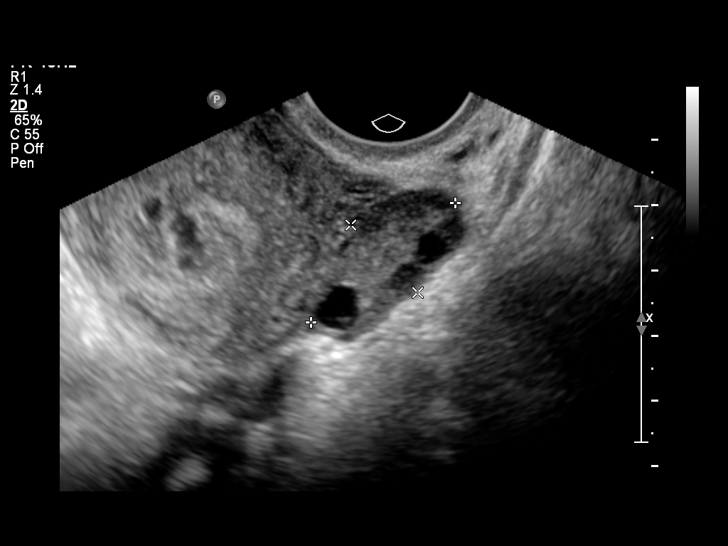
[im 29/47]
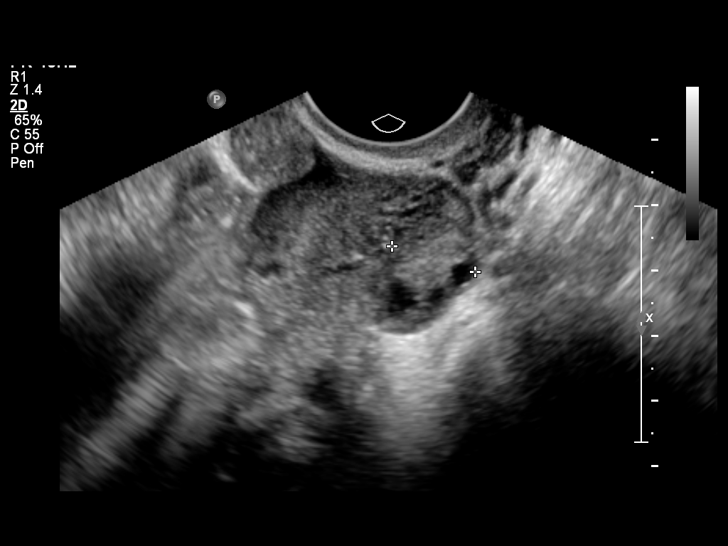
[im 31/47]
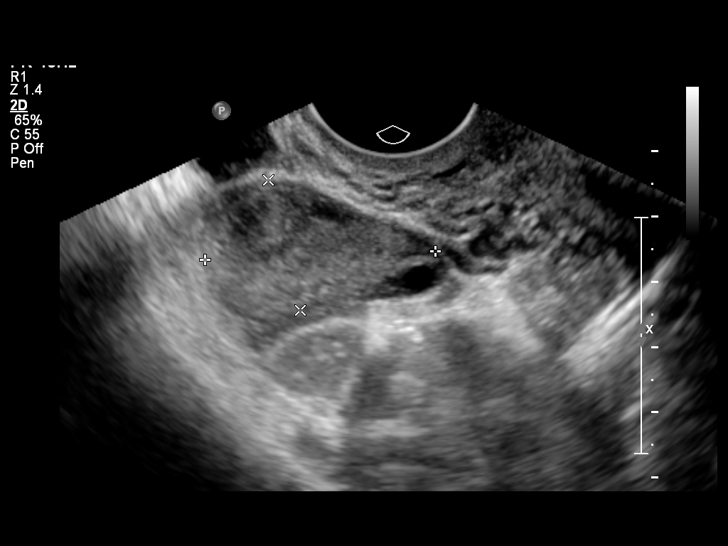
[im 35/47]
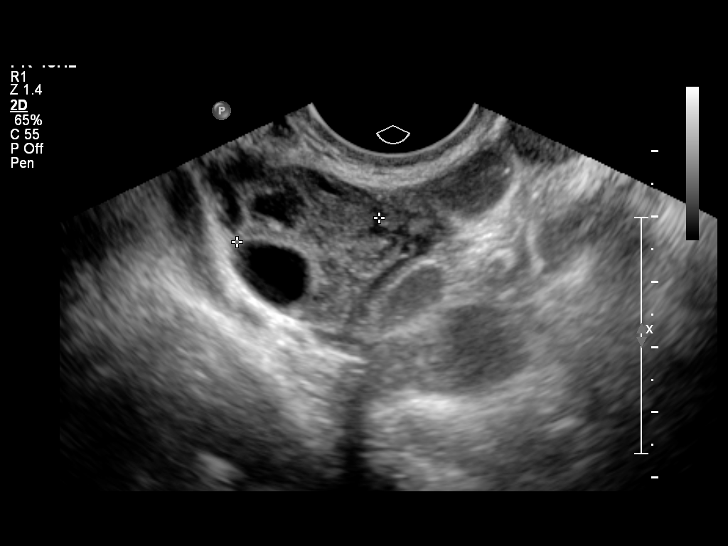
[im 39/47]
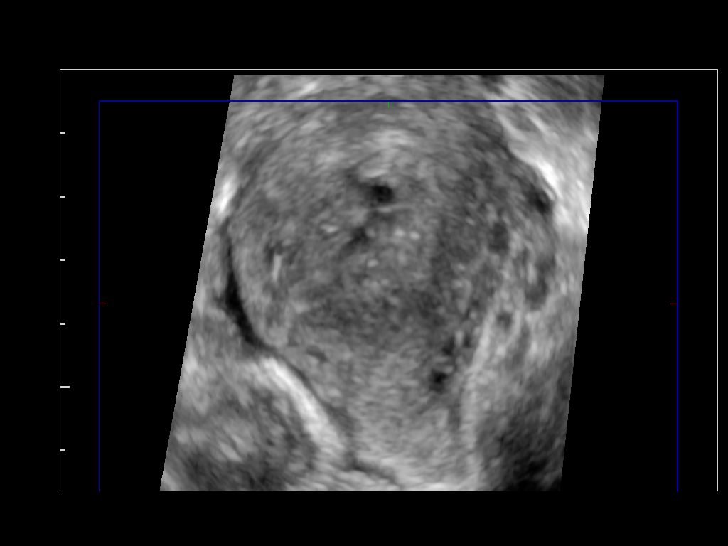
[im 43/47]
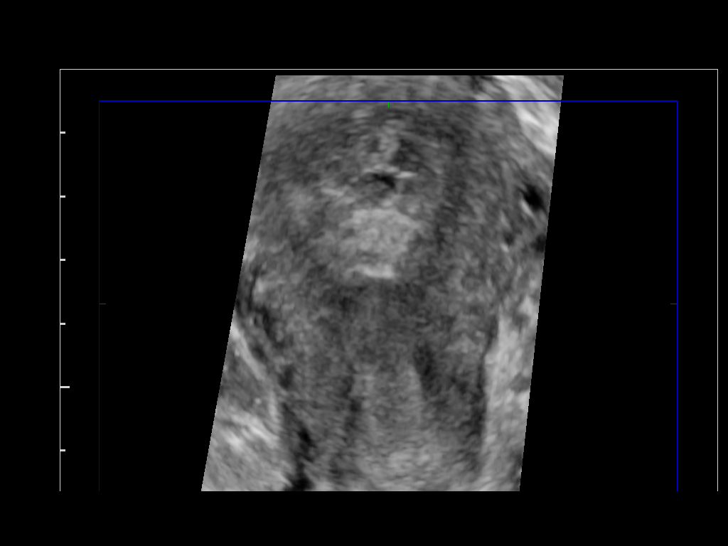
[im 47/47]
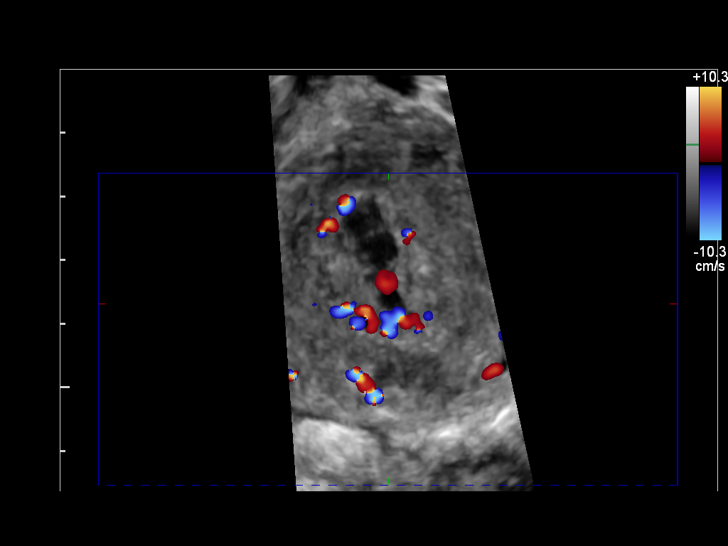

[14 of 25 positions shown; findings below may reference images not displayed]

FINDINGS: Uterus:  Normal in size and appearance, measuring 7.6 x 4.0 x
cm.

Endometrium: Solid and cystic echogenicity with increased color
Doppler flow.  Increased thickness at 15 mm.

Right ovary: Normal appearance/no adnexal mass.  Ovary measures
x 2.1 x 2.2 cm and contains a 1.4 cm corpus luteal cyst.

Left ovary: Normal appearance/no adnexal mass the ovary measures
2.9 x 1.5 x 1.3 cm.

Other Findings:  No free fluid
IMPRESSION: Complex and thickened appearance along the endometrium, favored to
reflect retained products of conception.  Gestational trophoblastic
disease is a less likely consideration.  Recommend correlation with
quantitative beta HCG level.

## 2019-03-30 ENCOUNTER — Encounter (HOSPITAL_COMMUNITY): Payer: Self-pay | Admitting: Emergency Medicine

## 2019-03-30 ENCOUNTER — Emergency Department (HOSPITAL_COMMUNITY): Payer: Self-pay

## 2019-03-30 ENCOUNTER — Other Ambulatory Visit: Payer: Self-pay

## 2019-03-30 ENCOUNTER — Emergency Department (HOSPITAL_COMMUNITY)
Admission: EM | Admit: 2019-03-30 | Discharge: 2019-03-30 | Disposition: A | Discharge status: Home / Self Care | Payer: Self-pay | Attending: Emergency Medicine | Admitting: Emergency Medicine

## 2019-03-30 DIAGNOSIS — Y999 Unspecified external cause status: Secondary | ICD-10-CM | POA: Insufficient documentation

## 2019-03-30 DIAGNOSIS — Y929 Unspecified place or not applicable: Secondary | ICD-10-CM | POA: Insufficient documentation

## 2019-03-30 DIAGNOSIS — Y939 Activity, unspecified: Secondary | ICD-10-CM | POA: Insufficient documentation

## 2019-03-30 DIAGNOSIS — S9031XA Contusion of right foot, initial encounter: Secondary | ICD-10-CM | POA: Insufficient documentation

## 2019-03-30 DIAGNOSIS — F17210 Nicotine dependence, cigarettes, uncomplicated: Secondary | ICD-10-CM | POA: Insufficient documentation

## 2019-03-30 DIAGNOSIS — W208XXA Other cause of strike by thrown, projected or falling object, initial encounter: Secondary | ICD-10-CM | POA: Insufficient documentation

## 2019-03-30 NOTE — ED Notes (Signed)
X-ray at bedside

## 2019-03-30 NOTE — ED Triage Notes (Signed)
Pt reports having tent pole dropped on right foot 2 days ago and having numbness and tingling in foot up into leg.

## 2019-03-30 NOTE — ED Provider Notes (Signed)
[facility] DEPT Provider Note   CSN: [id number] Arrival date & time: 03/30/19  1909    History   Chief Complaint Chief Complaint  Patient presents with  . Foot Pain    HPI Eileen Mcfarland is a 28 y.o. female.     The history is provided by the patient. No language interpreter was used.  Foot Pain   Eileen Mcfarland is a 28 y.o. female who presents to the Emergency Department complaining of foot pain. Two days ago a heavy tent was dropped on her right foot. She complains of pain in the right foot with local swelling and difficulty with weight bearing. She also reports paresthesias throughout the foot with pain shooting up her right leg. She has no medical problems and takes the medications. She has a history of tubal ligation. Symptoms are moderate and constant in nature. Past Medical History:  Diagnosis Date  . BV (bacterial vaginosis)   . Chlamydia   . Gonorrhea   . Infection   . Migraine   . Urinary tract infection     Patient Active Problem List   Diagnosis Date Noted  . Retained products of conception following abortion 01/10/2013  . Initiation of Depo Provera 01/10/2013    Past Surgical History:  Procedure Laterality Date  . HERNIA REPAIR     as a child     OB History    Gravida  3   Para  1   Term  0   Preterm      AB  2   Living  1     SAB      TAB  2   Ectopic      Multiple      Live Births               Home Medications    Prior to Admission medications   Medication Sig Start Date End Date Taking? Authorizing Provider  azithromycin (ZITHROMAX) 250 MG tablet Take 2 tabs PO x 1 dose, then 1 tab PO QD x 4 days 10/13/14   Ezekiel Slocumb, PA-C  benzonatate (TESSALON) 100 MG capsule Take 1-2 capsules (100-200 mg total) by mouth 3 (three) times daily as needed for cough. 10/13/14   Ezekiel Slocumb, PA-C  chlorpheniramine-HYDROcodone (TUSSIONEX PENNKINETIC ER) 10-8 MG/5ML LQCR Take 5 mLs by mouth every 12  (twelve) hours as needed for cough (cough). 10/13/14   Ezekiel Slocumb, PA-C    Family History Family History  Problem Relation Age of Onset  . Hypertension Maternal Uncle     Social History Social History   Tobacco Use  . Smoking status: Current Some Day Smoker    Packs/day: 3.00    Types: Cigars  . Smokeless tobacco: Never Used  Substance Use Topics  . Alcohol use: Yes    Comment: socially  . Drug use: No     Allergies   Patient has no known allergies.   Review of Systems Review of Systems  All other systems reviewed and are negative.    Physical Exam Updated Vital Signs BP 129/85 (BP Location: Right Arm)   Pulse 78   Temp 98.2 F (36.8 C) (Oral)   Resp 18   Ht 5\' 3"  (1.6 m)   Wt 63.5 kg   LMP 03/25/2019   SpO2 100%   BMI 24.80 kg/m   Physical Exam Vitals signs and nursing note reviewed.  Constitutional:      Appearance: She is well-developed.  HENT:     Head: Normocephalic and atraumatic.  Cardiovascular:     Rate and Rhythm: Normal rate and regular rhythm.  Pulmonary:     Effort: Pulmonary effort is normal. No respiratory distress.  Musculoskeletal:        General: No tenderness.     Comments: 2+ DP pulses bilaterally. There is mild to moderate swelling over the right mid foot with mild local tenderness. Flexion extension is intact at the ankle, wiggles toes. Sensation to light touch intact throughout the foot, leg.  Skin:    General: Skin is warm and dry.  Neurological:     Mental Status: She is alert and oriented to person, place, and time.     Comments: Five out of five strength in bilateral lower extremities  Psychiatric:        Behavior: Behavior normal.      ED Treatments / Results  Labs (all labs ordered are listed, but only abnormal results are displayed) Labs Reviewed - No data to display  EKG None  Radiology Dg Foot Complete Right  Result Date: 03/30/2019 CLINICAL DATA:  Blunt trauma to the right foot 2 days ago with  persistent pain, initial encounter EXAM: RIGHT FOOT COMPLETE - 3+ VIEW COMPARISON:  None. FINDINGS: No acute fracture or dislocation is noted. Mild soft tissue swelling is noted over the distal metacarpals dorsally. IMPRESSION: Soft tissue swelling without acute bony abnormality. Electronically Signed   By: Alcide CleverMark  Lukens M.D.   On: 03/30/2019 20:03    Procedures Procedures (including critical care time)  Medications Ordered in ED Medications - No data to display   Initial Impression / Assessment and Plan / ED Course  I have reviewed the triage vital signs and the nursing notes.  Pertinent labs & imaging results that were available during my care of the patient were reviewed by me and considered in my medical decision making (see chart for details).        Patient here for evaluation of right foot pain and paresthesias after dropping a heavy object on a two days ago. She does have swelling to the mid foot with good pulses. She is neurovascular early intact on examination. Imaging is negative for acute fracture. Discussed with patient home care for foot contusion and hematoma. Discussed outpatient follow-up and return precautions. Will place in postop shoe for comfort.  Final Clinical Impressions(s) / ED Diagnoses   Final diagnoses:  Contusion of right foot, initial encounter    ED Discharge Orders    None       Tilden Fossaees, Quintavius Niebuhr, MD 03/30/19 2353

## 2019-08-23 ENCOUNTER — Emergency Department (HOSPITAL_COMMUNITY)
Admission: EM | Admit: 2019-08-23 | Discharge: 2019-08-24 | Disposition: A | Discharge status: Home / Self Care | Payer: Self-pay | Attending: Emergency Medicine | Admitting: Emergency Medicine

## 2019-08-23 ENCOUNTER — Other Ambulatory Visit: Payer: Self-pay

## 2019-08-23 ENCOUNTER — Encounter (HOSPITAL_COMMUNITY): Payer: Self-pay | Admitting: Emergency Medicine

## 2019-08-23 DIAGNOSIS — R1032 Left lower quadrant pain: Secondary | ICD-10-CM | POA: Insufficient documentation

## 2019-08-23 DIAGNOSIS — R350 Frequency of micturition: Secondary | ICD-10-CM | POA: Insufficient documentation

## 2019-08-23 DIAGNOSIS — Z5321 Procedure and treatment not carried out due to patient leaving prior to being seen by health care provider: Secondary | ICD-10-CM | POA: Insufficient documentation

## 2019-08-23 LAB — URINALYSIS, ROUTINE W REFLEX MICROSCOPIC
Bacteria, UA: NONE SEEN
Bilirubin Urine: NEGATIVE
Glucose, UA: NEGATIVE mg/dL
Ketones, ur: NEGATIVE mg/dL
Leukocytes,Ua: NEGATIVE
Nitrite: NEGATIVE
Protein, ur: NEGATIVE mg/dL
Specific Gravity, Urine: 1.023 (ref 1.005–1.030)
pH: 5 (ref 5.0–8.0)

## 2019-08-23 LAB — POC URINE PREG, ED: Preg Test, Ur: NEGATIVE

## 2019-08-23 NOTE — ED Triage Notes (Signed)
Pt reports having flank pain on left side for the last several days along with urinary frequency.

## 2021-02-27 ENCOUNTER — Other Ambulatory Visit: Payer: Self-pay

## 2021-02-27 ENCOUNTER — Emergency Department (HOSPITAL_COMMUNITY)
Admission: EM | Admit: 2021-02-27 | Discharge: 2021-02-27 | Disposition: A | Discharge status: Home / Self Care | Payer: 59 | Attending: Emergency Medicine | Admitting: Emergency Medicine

## 2021-02-27 ENCOUNTER — Emergency Department (HOSPITAL_COMMUNITY): Payer: 59

## 2021-02-27 DIAGNOSIS — F17210 Nicotine dependence, cigarettes, uncomplicated: Secondary | ICD-10-CM | POA: Diagnosis not present

## 2021-02-27 DIAGNOSIS — R091 Pleurisy: Secondary | ICD-10-CM | POA: Diagnosis not present

## 2021-02-27 DIAGNOSIS — R0789 Other chest pain: Secondary | ICD-10-CM | POA: Diagnosis present

## 2021-02-27 NOTE — ED Provider Notes (Signed)
[facility]-EMERGENCY DEPT Provider Note   CSN: [id number] Arrival date & time: 02/27/21  1962     History Chief Complaint  Patient presents with   Chest Pain    Eileen Mcfarland is a 30 y.o. female.  HPI Patient reports she started getting chest pain on Thursday.  She reports she had a minimal cough and took some cough medicines that day and it has resolved.  She did not develop any fever.  She reports has had a little bit of a sore throat.  That also has resolved.  Patient reports her chest pain is in her left upper anterior chest.  Pain is a burning and sharp quality when she takes a deep breath.  No nausea or vomiting.  No radiation.  No lightheadedness or near syncope.  No swelling or pain in the lower extremities.  No recent long travel.  Patient is not on birth control pills she has had tubal ligation.  Patient smokes cigars.  No cigarette or vape.  No other drug use.  Family history: Uncle with coronary artery disease.  Both parents living in their 52s to 103s with no coronary artery disease.    Past Medical History:  Diagnosis Date   BV (bacterial vaginosis)    Chlamydia    Gonorrhea    Infection    Migraine    Urinary tract infection     Patient Active Problem List   Diagnosis Date Noted   Retained products of conception following abortion 01/10/2013   Initiation of Depo Provera 01/10/2013    Past Surgical History:  Procedure Laterality Date   HERNIA REPAIR     as a child     OB History     Gravida  3   Para  1   Term  0   Preterm      AB  2   Living  1      SAB      IAB  2   Ectopic      Multiple      Live Births              Family History  Problem Relation Age of Onset   Hypertension Maternal Uncle     Social History   Tobacco Use   Smoking status: Some Days    Packs/day: 3.00    Pack years: 0.00    Types: Cigars, Cigarettes   Smokeless tobacco: Never  Substance Use Topics   Alcohol use: Yes     Comment: socially   Drug use: No    Home Medications Prior to Admission medications   Medication Sig Start Date End Date Taking? Authorizing Provider  azithromycin (ZITHROMAX) 250 MG tablet Take 2 tabs PO x 1 dose, then 1 tab PO QD x 4 days 10/13/14   Dorna Leitz, PA-C  benzonatate (TESSALON) 100 MG capsule Take 1-2 capsules (100-200 mg total) by mouth 3 (three) times daily as needed for cough. 10/13/14   Dorna Leitz, PA-C  chlorpheniramine-HYDROcodone (TUSSIONEX PENNKINETIC ER) 10-8 MG/5ML LQCR Take 5 mLs by mouth every 12 (twelve) hours as needed for cough (cough). 10/13/14   Dorna Leitz, PA-C    Allergies    Patient has no known allergies.  Review of Systems   Review of Systems 10 Systems reviewed and negative except as per HPI Physical Exam Updated Vital Signs BP 126/90   Pulse 79   Temp 98.5 F (36.9 C) (Oral)   Resp Marland Kitchen)  21   Ht 5\' 3"  (1.6 m)   Wt 70.3 kg   SpO2 100%   BMI 27.46 kg/m   Physical Exam Constitutional:      Comments: Alert nontoxic clinically well in appearance.  HENT:     Head: Normocephalic and atraumatic.     Mouth/Throat:     Pharynx: Oropharynx is clear.  Eyes:     Extraocular Movements: Extraocular movements intact.  Cardiovascular:     Rate and Rhythm: Normal rate and regular rhythm.  Pulmonary:     Effort: Pulmonary effort is normal.     Breath sounds: Normal breath sounds.  Chest:     Chest wall: No tenderness.  Abdominal:     General: There is no distension.     Palpations: Abdomen is soft.     Tenderness: There is no abdominal tenderness. There is no guarding.  Musculoskeletal:        General: No swelling or tenderness. Normal range of motion.     Right lower leg: No edema.     Left lower leg: No edema.  Skin:    General: Skin is warm and dry.  Neurological:     General: No focal deficit present.     Mental Status: She is oriented to person, place, and time.     Cranial Nerves: No cranial nerve deficit.     Motor: No  weakness.     Coordination: Coordination normal.  Psychiatric:        Mood and Affect: Mood normal.    ED Results / Procedures / Treatments   Labs (all labs ordered are listed, but only abnormal results are displayed) Labs Reviewed  CBC WITH DIFFERENTIAL/PLATELET  BASIC METABOLIC PANEL  I-STAT BETA HCG BLOOD, ED ([facility], [facility], [facility] ONLY)  TROPONIN I (HIGH SENSITIVITY)    EKG EKG Interpretation  Date/Time:  Saturday February 27 2021 05:47:43 EDT Ventricular Rate:  79 PR Interval:  133 QRS Duration: 91 QT Interval:  377 QTC Calculation: 433 R Axis:   41 Text Interpretation: Sinus rhythm Normal ECG Confirmed by [birth year] (Geoffery Lyons) on 02/27/2021 6:43:12 AM  Radiology DG Chest Portable 1 View  Result Date: 02/27/2021 CLINICAL DATA:  Shortness of breath EXAM: PORTABLE CHEST 1 VIEW COMPARISON:  None. FINDINGS: Heart size and mediastinal contours are normal. Lungs are clear. Lung volumes are normal. No pleural effusion or pneumothorax is seen. Osseous structures about the chest are unremarkable. IMPRESSION: Normal chest x-ray.  No evidence of pneumonia or pulmonary edema. Electronically Signed   By: 04/30/2021 M.D.   On: 02/27/2021 06:51    Procedures Procedures   Medications Ordered in ED Medications - No data to display  ED Course  I have reviewed the triage vital signs and the nursing notes.  Pertinent labs & imaging results that were available during my care of the patient were reviewed by me and considered in my medical decision making (see chart for details).    MDM Rules/Calculators/A&P                         Patient is PERC negative.  Low cardiac risk factor.  Controlled hypertension and light tobacco use.  History is atypical for cardiac ischemic disease and EKG normal.  Patient is clinically well with normal physical exam.  At this time findings are most suggestive of pleurisy.  Pain is elicited by deep inspiration and local to the left upper and anterior chest.  No associated  shortness  of breath, hypoxia or tachypnea.  Patient describes some very mild URI symptoms now resolved.  At this time stable for discharge.  Plan will be for NSAIDs reviewed with the patient.  Strict return precautions reviewed.  Patient is advised to follow-up with PCP for recheck. Final Clinical Impression(s) / ED Diagnoses Final diagnoses:  Pleurisy    Rx / DC Orders ED Discharge Orders     None        Arby Barrette, MD 02/27/21 [phone]

## 2021-02-27 NOTE — Discharge Instructions (Addendum)
1.  You may take Aleve per package instructions twice daily or ibuprofen per package instructions 3 times a day. 2.  Return to the emergency department immediately if you develop fevers with cough, worsening or changing chest pain, shortness of breath or other concerning symptoms. 3.  Make a follow-up appointment with your doctor for recheck within the next 3 to 7 days.

## 2021-02-27 NOTE — ED Triage Notes (Signed)
Pt came in with centralized chest pain that started Thursday. Pt endorses SOB. Pt states that it is constant tonight. Pt has hx of htn. Pt takes 12.5mg  hctz

## 2023-03-18 IMAGING — DX DG CHEST 1V PORT
1 series · 1 of 1 positions shown · non-contrast
Comparison: None.

CLINICAL DATA: Shortness of breath

EXAM:
PORTABLE CHEST 1 VIEW

[chest ap]
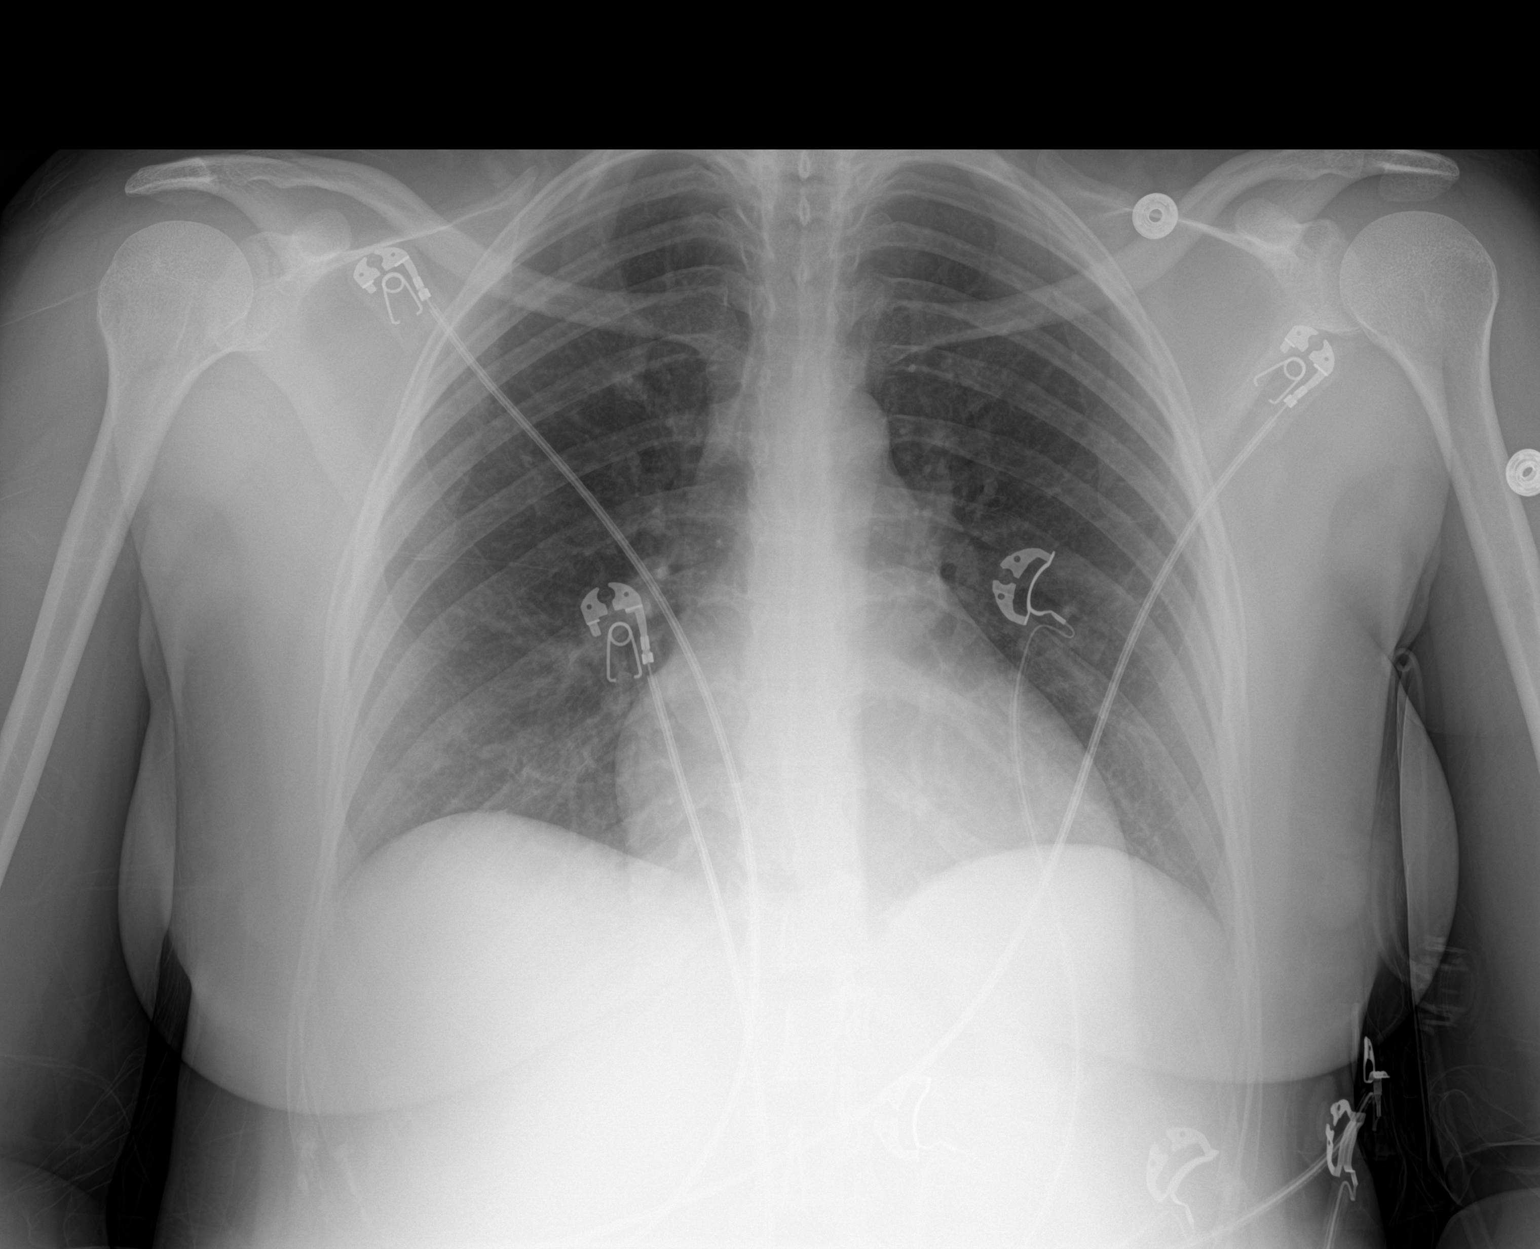

[1 of 1 positions shown; findings below may reference images not displayed]

FINDINGS: Heart size and mediastinal contours are normal. Lungs are clear.
Lung volumes are normal. No pleural effusion or pneumothorax is
seen. Osseous structures about the chest are unremarkable.
IMPRESSION: Normal chest x-ray.  No evidence of pneumonia or pulmonary edema.

## 2023-11-28 ENCOUNTER — Emergency Department (HOSPITAL_BASED_OUTPATIENT_CLINIC_OR_DEPARTMENT_OTHER)

## 2023-11-28 ENCOUNTER — Encounter (HOSPITAL_BASED_OUTPATIENT_CLINIC_OR_DEPARTMENT_OTHER): Payer: Self-pay

## 2023-11-28 ENCOUNTER — Emergency Department (HOSPITAL_BASED_OUTPATIENT_CLINIC_OR_DEPARTMENT_OTHER)
Admission: EM | Admit: 2023-11-28 | Discharge: 2023-11-28 | Disposition: A | Discharge status: Home / Self Care | Attending: Emergency Medicine | Admitting: Emergency Medicine

## 2023-11-28 ENCOUNTER — Emergency Department (HOSPITAL_BASED_OUTPATIENT_CLINIC_OR_DEPARTMENT_OTHER): Admitting: Radiology

## 2023-11-28 ENCOUNTER — Other Ambulatory Visit: Payer: Self-pay

## 2023-11-28 DIAGNOSIS — J90 Pleural effusion, not elsewhere classified: Secondary | ICD-10-CM | POA: Diagnosis not present

## 2023-11-28 DIAGNOSIS — R000 Tachycardia, unspecified: Secondary | ICD-10-CM | POA: Diagnosis not present

## 2023-11-28 DIAGNOSIS — I3139 Other pericardial effusion (noninflammatory): Secondary | ICD-10-CM | POA: Diagnosis not present

## 2023-11-28 DIAGNOSIS — R051 Acute cough: Secondary | ICD-10-CM | POA: Diagnosis present

## 2023-11-28 DIAGNOSIS — R791 Abnormal coagulation profile: Secondary | ICD-10-CM | POA: Diagnosis not present

## 2023-11-28 LAB — BASIC METABOLIC PANEL WITH GFR
Anion gap: 10 (ref 5–15)
BUN: 11 mg/dL (ref 6–20)
CO2: 26 mmol/L (ref 22–32)
Calcium: 9.4 mg/dL (ref 8.9–10.3)
Chloride: 101 mmol/L (ref 98–111)
Creatinine, Ser: 0.78 mg/dL (ref 0.44–1.00)
GFR, Estimated: >60 mL/min (ref >60)
Glucose, Bld: 88 mg/dL (ref 70–99)
Potassium: 3.9 mmol/L (ref 3.5–5.1)
Sodium: 137 mmol/L (ref 135–145)

## 2023-11-28 LAB — CBC WITH DIFFERENTIAL/PLATELET
Abs Immature Granulocytes: 0.04 10*3/uL (ref 0.00–0.07)
Basophils Absolute: 0.1 10*3/uL (ref 0.0–0.1)
Basophils Relative: 1 %
Eosinophils Absolute: 0.4 10*3/uL (ref 0.0–0.5)
Eosinophils Relative: 5 %
HCT: 37.3 % (ref 36.0–46.0)
Hemoglobin: 12.2 g/dL (ref 12.0–15.0)
Immature Granulocytes: 1 %
Lymphocytes Relative: 29 %
Lymphs Abs: 2.5 10*3/uL (ref 0.7–4.0)
MCH: 28.8 pg (ref 26.0–34.0)
MCHC: 32.7 g/dL (ref 30.0–36.0)
MCV: 88.2 fL (ref 80.0–100.0)
Monocytes Absolute: 0.7 10*3/uL (ref 0.1–1.0)
Monocytes Relative: 9 %
Neutro Abs: 4.8 10*3/uL (ref 1.7–7.7)
Neutrophils Relative %: 55 %
Platelets: 278 10*3/uL (ref 150–400)
RBC: 4.23 MIL/uL (ref 3.87–5.11)
RDW: 13.6 % (ref 11.5–15.5)
WBC: 8.6 10*3/uL (ref 4.0–10.5)
nRBC: 0 % (ref 0.0–0.2)

## 2023-11-28 LAB — RESP PANEL BY RT-PCR (RSV, FLU A&B, COVID)  RVPGX2
Influenza A by PCR: NEGATIVE
Influenza B by PCR: NEGATIVE
Resp Syncytial Virus by PCR: NEGATIVE
SARS Coronavirus 2 by RT PCR: NEGATIVE

## 2023-11-28 LAB — BRAIN NATRIURETIC PEPTIDE: B Natriuretic Peptide: 17 pg/mL (ref 0.0–100.0)

## 2023-11-28 LAB — TROPONIN I (HIGH SENSITIVITY): Troponin I (High Sensitivity): <2 ng/L (ref <18)

## 2023-11-28 LAB — D-DIMER, QUANTITATIVE: D-Dimer, Quant: 0.61 ug{FEU}/mL — ABNORMAL HIGH (ref 0.00–0.50)

## 2023-11-28 MED ORDER — IPRATROPIUM-ALBUTEROL 0.5-2.5 (3) MG/3ML IN SOLN
3.0000 mL | Freq: Once | RESPIRATORY_TRACT | Status: AC
  Administered 2023-11-28: 3 mL via RESPIRATORY_TRACT
  Filled 2023-11-28: qty 3

## 2023-11-28 MED ORDER — ALBUTEROL SULFATE HFA 108 (90 BASE) MCG/ACT IN AERS: 2.0000 | INHALATION_SPRAY | RESPIRATORY_TRACT | Status: DC | PRN

## 2023-11-28 MED ORDER — ALBUTEROL SULFATE HFA 108 (90 BASE) MCG/ACT IN AERS: 1.0000 | INHALATION_SPRAY | Freq: Four times a day (QID) | RESPIRATORY_TRACT | 0 refills | Status: AC | PRN

## 2023-11-28 MED ORDER — IOHEXOL 350 MG/ML SOLN
100.0000 mL | Freq: Once | INTRAVENOUS | Status: AC | PRN
  Administered 2023-11-28: 100 mL via INTRAVENOUS

## 2023-11-28 NOTE — ED Provider Notes (Signed)
 [place] EMERGENCY DEPARTMENT AT [facility] Provider Note   CSN: [id number] Arrival date & time: 11/28/23  1312     History  Chief Complaint  Patient presents with   Cough   Headache    Eileen Mcfarland is a 33 y.o. female.  She has no significant PMH.  Presents to the ER complaining of cough and increased shortness of breath on exertion for 2 weeks.  She states 2 weeks ago she had what she felt was a viral illness, had 24 hours of nausea vomiting and diarrhea which resolved and subsequently had about 24 hours of fever and chills which also resolved.  She was having cough at that time.  She went to urgent care after several days and was given benzonatate for her cough.  She states it was working and her cough had resolved but came back 3 days ago.  Patient's been noticing increased shortness of breath on walking.  No lower extremity swelling or pain.  No chest pain, no hemoptysis.  She is not on any hormonal birth controls, has had tubal ligation in the past.  She denies GERD symptoms, denies sinus congestion or pressure.  Cough has been dry.  He has some codeine cough syrup leftover from an illness last year that was not effective her cough either.  No history of asthma, she does not smoke.  History of VTE.  No alcohol or drug use.  No sick contacts.  Aside from isolated fever 2 weeks ago no further fevers.  Cough Associated symptoms: headaches and shortness of breath   Associated symptoms: no chest pain   Headache Associated symptoms: cough        Home Medications Prior to Admission medications   Medication Sig Start Date End Date Taking? Authorizing Provider  albuterol (VENTOLIN HFA) 108 (90 Base) MCG/ACT inhaler Inhale 1-2 puffs into the lungs every 6 (six) hours as needed for wheezing or shortness of breath. 11/28/23  Yes Fredderick Swanger A, PA-C  azithromycin (ZITHROMAX) 250 MG tablet Take 2 tabs PO x 1 dose, then 1 tab PO QD x 4 days 10/13/14   Dorna Leitz, PA-C   benzonatate (TESSALON) 100 MG capsule Take 1-2 capsules (100-200 mg total) by mouth 3 (three) times daily as needed for cough. 10/13/14   Dorna Leitz, PA-C  chlorpheniramine-HYDROcodone (TUSSIONEX PENNKINETIC ER) 10-8 MG/5ML LQCR Take 5 mLs by mouth every 12 (twelve) hours as needed for cough (cough). 10/13/14   Dorna Leitz, PA-C      Allergies    Patient has no known allergies.    Review of Systems   Review of Systems  Respiratory:  Positive for cough and shortness of breath.   Cardiovascular:  Negative for chest pain, palpitations and leg swelling.  Neurological:  Positive for headaches.    Physical Exam Updated Vital Signs BP 135/81   Pulse 97   Temp 98.2 F (36.8 C)   Resp 16   Ht 5\' 3"  (1.6 m)   Wt 77.1 kg   LMP 11/20/2023   SpO2 100%   BMI 30.11 kg/m  Physical Exam Vitals and nursing note reviewed.  Constitutional:      General: She is not in acute distress.    Appearance: She is well-developed.  HENT:     Head: Normocephalic and atraumatic.     Mouth/Throat:     Mouth: Mucous membranes are moist.     Pharynx: Postnasal drip present. No pharyngeal swelling or posterior oropharyngeal erythema.  Tonsils: No tonsillar exudate.  Eyes:     Extraocular Movements: Extraocular movements intact.     Conjunctiva/sclera: Conjunctivae normal.     Pupils: Pupils are equal, round, and reactive to light.  Cardiovascular:     Rate and Rhythm: Normal rate and regular rhythm.     Heart sounds: No murmur heard. Pulmonary:     Effort: Pulmonary effort is normal. No respiratory distress.     Breath sounds: Normal breath sounds.  Abdominal:     Palpations: Abdomen is soft.     Tenderness: There is no abdominal tenderness.  Musculoskeletal:        General: No swelling, deformity or signs of injury.     Cervical back: Neck supple.     Right lower leg: No edema.     Left lower leg: No edema.  Skin:    General: Skin is warm and dry.     Capillary Refill: Capillary  refill takes less than 2 seconds.  Neurological:     General: No focal deficit present.     Mental Status: She is alert and oriented to person, place, and time.  Psychiatric:        Mood and Affect: Mood normal.     ED Results / Procedures / Treatments   Labs (all labs ordered are listed, but only abnormal results are displayed) Labs Reviewed  D-DIMER, QUANTITATIVE - Abnormal; Notable for the following components:      Result Value   D-Dimer, Quant 0.61 (*)    All other components within normal limits  RESP PANEL BY RT-PCR (RSV, FLU A&B, COVID)  [id number]  CBC WITH DIFFERENTIAL/PLATELET  BASIC METABOLIC PANEL WITH GFR  BRAIN NATRIURETIC PEPTIDE  TROPONIN I (HIGH SENSITIVITY)    EKG EKG Interpretation Date/Time:  Tuesday November 28 2023 13:50:43 EDT Ventricular Rate:  103 PR Interval:  132 QRS Duration:  78 QT Interval:  306 QTC Calculation: 400 R Axis:   50  Text Interpretation: Sinus tachycardia Nonspecific T wave abnormality Abnormal ECG When compared with ECG of 27-Feb-2021 05:47,  Similar TW changes in lead III to prior Confirmed by Alvira Monday ([id number]) on 11/28/2023 9:08:14 PM  Radiology CT Angio Chest PE W and/or Wo Contrast Result Date: 11/28/2023 CLINICAL DATA:  Pulmonary embolism (PE) suspected, low to intermediate prob, positive D-dimer Cough and shortness of breath. EXAM: CT ANGIOGRAPHY CHEST WITH CONTRAST TECHNIQUE: Multidetector CT imaging of the chest was performed using the standard protocol during bolus administration of intravenous contrast. Multiplanar CT image reconstructions and MIPs were obtained to evaluate the vascular anatomy. RADIATION DOSE REDUCTION: This exam was performed according to the departmental dose-optimization program which includes automated exposure control, adjustment of the mA and/or kV according to patient size and/or use of iterative reconstruction technique. CONTRAST:  OMNIPAQUE IOHEXOL 350 MG/ML SOLN COMPARISON:  Radiograph earlier  today FINDINGS: Cardiovascular: There are no filling defects within the pulmonary arteries to suggest pulmonary embolus. The heart is normal in size. Small pericardial effusion. Normal thoracic aorta without acute aortic findings. Mediastinum/Nodes: No suspicious lymphadenopathy. Unremarkable appearance of the esophagus. No visible thyroid nodule. Lungs/Pleura: Trace pleural effusions. No focal airspace disease. No pulmonary nodule. Features of pulmonary edema. Trachea and central airways are clear. Upper Abdomen: Hepatic steatosis. Musculoskeletal: There are no acute or suspicious osseous abnormalities. Review of the MIP images confirms the above findings. IMPRESSION: 1. No pulmonary embolus. 2. Trace pleural effusions. Small pericardial effusion. 3. Hepatic steatosis. Electronically Signed   By: Ivette Loyal.D.  On: 11/28/2023 20:38   DG Chest 2 View Result Date: 11/28/2023 CLINICAL DATA:  Shortness of breath. EXAM: CHEST - 2 VIEW COMPARISON:  02/27/2021. FINDINGS: Bilateral lung fields are clear. Bilateral costophrenic angles are clear. Normal cardio-mediastinal silhouette. No acute osseous abnormalities. The soft tissues are within normal limits. IMPRESSION: No active cardiopulmonary disease. Electronically Signed   By: Jules Schick M.D.   On: 11/28/2023 14:43    Procedures Procedures    Medications Ordered in ED Medications  ipratropium-albuterol (DUONEB) 0.5-2.5 (3) MG/3ML nebulizer solution 3 mL (3 mLs Nebulization Given 11/28/23 1634)  iohexol (OMNIPAQUE) 350 MG/ML injection 100 mL (100 mLs Intravenous Contrast Given 11/28/23 1803)    ED Course/ Medical Decision Making/ A&P                                 Medical Decision Making This patient presents to the ED for concern of cough and increased shortness of breath on exertion that started 2 weeks ago after viral illness, this involves an extensive number of treatment options, and is a complaint that carries with it a high risk of  complications and morbidity.  The differential diagnosis includes viral illness, ACS, PE, pneumonia, bronchitis, asthma, pericarditis, myocarditis, other   Co morbidities that complicate the patient evaluation :   none   Additional history obtained:  Additional history obtained from EMR External records from outside source obtained and reviewed including prior notes   Lab Tests:  I Ordered, and personally interpreted labs.  The pertinent results include: CBC and BMP are normal, respiratory panel is normal, D-dimer ordered and is slightly elevated at 0.61, troponin negative, BNP is normal   Imaging Studies ordered:  I ordered imaging studies including chest x-ray which shows no pulmonary edema or infiltrate, no pneumothorax; CT angio of the chest shows no PE, no pneumonia, does show small bilateral pleural effusions and small pericardial effusion I independently visualized and interpreted imaging within scope of identifying emergent findings  I agree with the radiologist interpretation   Cardiac Monitoring: / EKG:  The patient was maintained on a cardiac monitor.  I personally viewed and interpreted the cardiac monitored which showed an underlying rhythm of: Sinus rhythm, appears similar to previous with nonspecific T wave changes      Problem List / ED Course / Critical interventions / Medication management   Off with exertional dyspnea intermittently x 2 weeks worse in the past several days-negative for COVID flu RSV, chest x-ray normal.  CBC BMP normal, D-dimer added on as patient was tachycardic but low risk [place].  This was positive but patient CTA did not show PE but did show small pericardial effusion and pleural effusion.  Patient's rate had initially improved but she still mildly tachycardic around 104 bpm.  She is not having any chest pain or shortness of breath at rest and the room is comfortable but discussed with her we will add on troponin and BNP to rule out possible  myocarditis.  If these are normal and she is feeling well and vitals are stable she can likely go home and follow-up closely with cardiology, but if positive may need cardiology consult versus admission.  She is agreeable with this plan.  Patient's BNP and troponin were both normal.  Discussed with patient need for close outpatient follow-up with cardiology.  I replaced a referral.  She is feeling better, her symptoms did improve with a nebulizer so she was  given albuterol inhaler for home for her cough.  She is aware of her abnormal CT findings and need for close outpatient follow-up.  At this time I do not feel she needs hospitalization.  Do not suspect myocarditis or pericarditis based on her symptoms she is not having chest pain.  I discussed case with attending Dr. Dalene Seltzer while patient was in the ED.  I have reviewed the patients home medicines and have made adjustments as needed   Social Determinants of Health: Patient lives independently, non-smoker    Amount and/or Complexity of Data Reviewed Labs: ordered. Radiology: ordered.  Risk Prescription drug management.           Final Clinical Impression(s) / ED Diagnoses Final diagnoses:  Acute cough  Pericardial effusion  Pleural effusion    Rx / DC Orders ED Discharge Orders          Ordered    Ambulatory referral to Cardiology       Comments: If you have not heard from the Cardiology office within the next 72 hours please call [phone].   11/28/23 2208    albuterol (VENTOLIN HFA) 108 (90 Base) MCG/ACT inhaler  Every 6 hours PRN        11/28/23 2214              Josem Kaufmann 11/28/23 2339    Alvira Monday, MD 11/29/23 1202

## 2023-11-28 NOTE — Discharge Instructions (Addendum)
 It was a pleasure taking care of you today.  You are seen for persistent cough and shortness of breath on exertion that has been ongoing over the past 2 weeks.  You had a CT scan of your chest which showed a small amount of fluid around your heart and lungs, at this time this is not an emergency, this is extremely important for you to follow-up closely with the cardiologist.  If you develop chest pain, dizziness, worsening shortness of breath or any other new or worsening symptoms come back to the emergency department immediately.  I have placed referral to cardiology.  Please call if you have not heard with them within 72 hours to schedule close follow-up appointment.

## 2023-11-28 NOTE — ED Triage Notes (Signed)
 Patient arrives POV with complaints of cough, increased shortness of breath with activity x2 weeks and headache.

## 2023-11-30 ENCOUNTER — Encounter: Payer: Self-pay | Admitting: Cardiovascular Disease

## 2023-11-30 ENCOUNTER — Ambulatory Visit: Attending: Cardiovascular Disease | Admitting: Cardiovascular Disease

## 2023-11-30 VITALS — BP 126/84 | HR 115 | Ht 63.0 in | Wt 171.6 lb

## 2023-11-30 DIAGNOSIS — I3139 Other pericardial effusion (noninflammatory): Secondary | ICD-10-CM

## 2023-11-30 NOTE — Progress Notes (Signed)
 Chief Complaint  Patient presents with   New Patient (Initial Visit)    Pericardial effusion    History of Present Illness: 33 yo female with history of HTN and migraines here today as a new patient for the evaluation of pericardial effusion noted on chest CTA 11/28/23. She was in the ED with c/o cough and dyspnea. Chest CTA with no evidence of PE. There was a small pericardial effusion. She was felt to have a viral syndrome. She tells me today that she has had a cough for a month. She has dyspnea. No LE edema or chest pain. No fevers or chills.   Primary Care Physician: [facility]   Past Medical History:  Diagnosis Date   BV (bacterial vaginosis)    Chlamydia    Gonorrhea    Hypertension    Infection    Migraine    Urinary tract infection     Past Surgical History:  Procedure Laterality Date   HERNIA REPAIR     as a child    Current Outpatient Medications  Medication Sig Dispense Refill   albuterol (VENTOLIN HFA) 108 (90 Base) MCG/ACT inhaler Inhale 1-2 puffs into the lungs every 6 (six) hours as needed for wheezing or shortness of breath. 18 g 0   amLODipine (NORVASC) 5 MG tablet Take 5 mg by mouth daily.     benzonatate (TESSALON) 100 MG capsule Take 1-2 capsules (100-200 mg total) by mouth 3 (three) times daily as needed for cough. 40 capsule 0   omeprazole (PRILOSEC) 20 MG capsule Take 20 mg by mouth daily.     spironolactone (ALDACTONE) 50 MG tablet Take 50 mg by mouth daily.     No current facility-administered medications for this visit.    No Known Allergies  Social History   Socioeconomic History   Marital status: Single    Spouse name: Not on file   Number of children: 2   Years of education: Not on file   Highest education level: Not on file  Occupational History   Occupation: Works for [employer]  Tobacco Use   Smoking status: Former    Current packs/day: 3.00    Types: Cigars, Cigarettes   Smokeless tobacco: Never   Substance and Sexual Activity   Alcohol use: Yes    Comment: socially   Drug use: No   Sexual activity: Yes    Birth control/protection: None  Other Topics Concern   Not on file  Social History Narrative   Not on file   Social Drivers of Health   Financial Resource Strain: Low Risk  (09/07/2023)   Received from [facility]   Overall Financial Resource Strain (CARDIA)    Difficulty of Paying Living Expenses: Not hard at all  Food Insecurity: No Food Insecurity (09/07/2023)   Received from [facility]   Hunger Vital Sign    Worried About Running Out of Food in the Last Year: Never true    Ran Out of Food in the Last Year: Never true  Transportation Needs: No Transportation Needs (09/07/2023)   Received from [facility] - Transportation    Lack of Transportation (Medical): No    Lack of Transportation (Non-Medical): No  Physical Activity: Sufficiently Active (09/30/2022)   Received from [facility]   Exercise Vital Sign    Days of Exercise per Week: 3 days    Minutes of Exercise per Session: 60 min  Stress: No Stress Concern Present (09/30/2022)  Received from [facility] - Occupational Stress Questionnaire    Feeling of Stress : Not at all  Social Connections: Socially Integrated (09/30/2022)   Received from [facility]   Social Network    How would you rate your social network (family, work, friends)?: Good participation with social networks  Intimate Partner Violence: Not At Risk (09/30/2022)   Received from [facility]   HITS    Over the last 12 months how often did your partner physically hurt you?: Never    Over the last 12 months how often did your partner insult you or talk down to you?: Never    Over the last 12 months how often did your partner threaten you with physical harm?: Never    Over the last 12 months how often did your partner scream or curse at you?: Never    Family History  Problem Relation  Age of Onset   Heart failure Father    Hypertension Maternal Uncle     Review of Systems:  As stated in the HPI and otherwise negative.   BP 126/84   Pulse (!) 115   Ht 5\' 3"  (1.6 m)   Wt 77.8 kg   LMP 11/20/2023   SpO2 99%   BMI 30.40 kg/m   Physical Examination: General: Well developed, well nourished, NAD  HEENT: OP clear, mucus membranes moist  SKIN: warm, dry. No rashes. Neuro: No focal deficits  Musculoskeletal: Muscle strength 5/5 all ext  Psychiatric: Mood and affect normal  Neck: No JVD, no carotid bruits, no thyromegaly, no lymphadenopathy.  Lungs:Clear bilaterally, no wheezes, rhonci, crackles Cardiovascular: Tachy, regular rhythm. No murmurs, gallops or rubs. Abdomen:Soft. Bowel sounds present. Non-tender.  Extremities: No lower extremity edema. Pulses are 2 + in the bilateral DP/PT.  EKG:  EKG is not ordered today. The ekg ordered today demonstrates   Recent Labs: 11/28/2023: B Natriuretic Peptide 17.0; BUN 11; Creatinine, Ser 0.78; Hemoglobin 12.2; Platelets 278; Potassium 3.9; Sodium 137   Lipid Panel No results found for: "CHOL", "TRIG", "HDL", "CHOLHDL", "VLDL", "LDLCALC", "LDLDIRECT"   Wt Readings from Last 3 Encounters:  11/30/23 77.8 kg  11/28/23 77.1 kg  02/27/21 70.3 kg    Assessment and Plan:   1. Pericardial effusion: This is likely post-inflammatory following recent viral syndrome with cough. Also with trace pleural effusions. Will arrange echo to assess LV function and assess pericardial effusion. No volume overload on exam. Lung and cardiac exam are normal except for tachycardia. (Sinus tach on EKG last week in ED with no evidence of PE on CTA).   Labs/ tests ordered today include:   Orders Placed This Encounter  Procedures   ECHOCARDIOGRAM COMPLETE   Disposition:   F/U with me as needed based on testing results   Signed, Verne Carrow, MD, [facility] 11/30/2023 2:40 PM    [facility] [address] [place],  [place], [place]  [address] Phone: [phone]; Fax: [phone]

## 2023-11-30 NOTE — Patient Instructions (Signed)
 Medication Instructions:  No changes *If you need a refill on your cardiac medications before your next appointment, please call your pharmacy*  Lab Work: none If you have labs (blood work) drawn today and your tests are completely normal, you will receive your results only by: MyChart Message (if you have MyChart) OR A paper copy in the mail If you have any lab test that is abnormal or we need to change your treatment, we will call you to review the results.  Testing/Procedures: needed within the next week please any location!! Your physician has requested that you have an echocardiogram. Echocardiography is a painless test that uses sound waves to create images of your heart. It provides your doctor with information about the size and shape of your heart and how well your heart's chambers and valves are working. This procedure takes approximately one hour. There are no restrictions for this procedure. Please do NOT wear cologne, perfume, aftershave, or lotions (deodorant is allowed). Please arrive 15 minutes prior to your appointment time.  Please note: We ask at that you not bring children with you during ultrasound (echo/ vascular) testing. Due to room size and safety concerns, children are not allowed in the ultrasound rooms during exams. Our front office staff cannot provide observation of children in our lobby area while testing is being conducted. An adult accompanying a patient to their appointment will only be allowed in the ultrasound room at the discretion of the ultrasound technician under special circumstances. We apologize for any inconvenience.   Follow-Up: As needed based on echo results      1st Floor: - Lobby - Registration  - Pharmacy  - Lab - Cafe  2nd Floor: - PV Lab - Diagnostic Testing (echo, CT, nuclear med)  3rd Floor: - Vacant  4th Floor: - TCTS (cardiothoracic surgery) - AFib Clinic - Structural Heart Clinic - Vascular Surgery  - Vascular  Ultrasound  5th Floor: - HeartCare Cardiology (general and EP) - Clinical Pharmacy for coumadin, hypertension, lipid, weight-loss medications, and med management appointments    Valet parking services will be available as well.

## 2023-12-06 ENCOUNTER — Ambulatory Visit (HOSPITAL_COMMUNITY)

## 2023-12-11 ENCOUNTER — Encounter (HOSPITAL_COMMUNITY): Payer: Self-pay | Admitting: Cardiovascular Disease
